# Patient Record
Sex: Female | Born: 1990 | Race: White | Hispanic: No | Marital: Single | Smoking: Former smoker
Health system: Southern US, Community
[De-identification: ages and names within clinical notes are randomized; demographics above are authoritative.]

## PROBLEM LIST (undated history)

## (undated) ENCOUNTER — Inpatient Hospital Stay (HOSPITAL_COMMUNITY): Payer: Self-pay

## (undated) DIAGNOSIS — Z789 Other specified health status: Secondary | ICD-10-CM

## (undated) HISTORY — PX: NO PAST SURGERIES: SHX2092

---

## 2011-09-01 ENCOUNTER — Encounter: Payer: Self-pay | Admitting: Emergency Medicine

## 2011-09-01 ENCOUNTER — Emergency Department (HOSPITAL_COMMUNITY)
Admission: EM | Admit: 2011-09-01 | Discharge: 2011-09-01 | Disposition: A | Discharge status: Home / Self Care | Payer: Managed Care, Other (non HMO) | Attending: Emergency Medicine | Admitting: Emergency Medicine

## 2011-09-01 ENCOUNTER — Emergency Department (HOSPITAL_COMMUNITY): Payer: Managed Care, Other (non HMO)

## 2011-09-01 DIAGNOSIS — J939 Pneumothorax, unspecified: Secondary | ICD-10-CM

## 2011-09-01 DIAGNOSIS — R079 Chest pain, unspecified: Secondary | ICD-10-CM | POA: Insufficient documentation

## 2011-09-01 DIAGNOSIS — R0602 Shortness of breath: Secondary | ICD-10-CM | POA: Insufficient documentation

## 2011-09-01 DIAGNOSIS — J9383 Other pneumothorax: Secondary | ICD-10-CM | POA: Insufficient documentation

## 2011-09-01 DIAGNOSIS — F17200 Nicotine dependence, unspecified, uncomplicated: Secondary | ICD-10-CM | POA: Insufficient documentation

## 2011-09-01 DIAGNOSIS — M25519 Pain in unspecified shoulder: Secondary | ICD-10-CM | POA: Insufficient documentation

## 2011-09-01 DIAGNOSIS — M549 Dorsalgia, unspecified: Secondary | ICD-10-CM | POA: Insufficient documentation

## 2011-09-01 MED ORDER — HYDROCODONE-ACETAMINOPHEN 5-325 MG PO TABS
1.0000 | ORAL_TABLET | Freq: Once | ORAL | Status: AC
  Administered 2011-09-01: 1 via ORAL
  Filled 2011-09-01: qty 1

## 2011-09-01 NOTE — ED Provider Notes (Signed)
20 year old, female, who smoked until today.  She complains of left-sided chest pain since yesterday.  He had sudden onset.  She denies a cough.  She says she has a sensation of slight shortness of breath.  When she takes a deep inspiration.  She went to an urgent care center, where she was seen and had a chest x-ray that showed an approximately 15% pneumothorax.  So she presented here.  She does not have pain or difficulty breathing.  When she is quiet respirations.  Her lungs are clear to auscultation nonlabored bilaterally.  We will put her in observation to perform repeat chest x-ray, and for observation.  There is no indication for a chest tube at this time.  3:19 PM No sxs. No change in size of ptx.  Discussed with Dr. Fredia Sorrow.   Will release and have return tomorrow.  Sooner if sxs increase  Nicholes Stairs, MD 09/01/11 1520

## 2011-09-01 NOTE — ED Notes (Signed)
Patient is resting comfortably. States pain is better. Denies new changes.

## 2011-09-01 NOTE — ED Notes (Signed)
States pain began yesterday while driving and progressively became worse. Seen at [facility] this am and dx with left apical pneumothorax. Hx of smoking. Denies other complaints. Plan is to monitor pt and repeat cxr in 6 hours.

## 2011-09-01 NOTE — ED Notes (Signed)
Pain in back last night and then her chest started  To hurt  Went to [facility] today had cxray and was found to have pneumo left side

## 2011-09-01 NOTE — ED Notes (Signed)
Pt denies status change. Small area of sq emphysema noticed under pt's left breast. Edp/pa aware, order given.

## 2011-09-01 NOTE — ED Provider Notes (Signed)
History     CSN: [id number] Arrival date & time: 09/01/2011 10:20 AM   First MD Initiated Contact with Patient 09/01/11 1031      No chief complaint on file.   (Consider location/radiation/quality/duration/timing/severity/associated sxs/prior treatment) Patient is a 20 y.o. female presenting with shortness of breath. The history is provided by the patient.  Shortness of Breath  The current episode started yesterday. The onset was sudden. The problem has been unchanged. The symptoms are relieved by nothing. The symptoms are aggravated by nothing. Associated symptoms include chest pain and shortness of breath. Pertinent negatives include no fever, no rhinorrhea, no sore throat and no cough.  Onset L back and shoulder pain last night while driving, associated with SOB at onset. Pt took a flexeril without relief. This AM she went to an outside [facility] and was found to have L pneumothorax. Pt is a smoker. She does not have a family history of pneumothorax.   History reviewed. No pertinent past medical history.  History reviewed. No pertinent past surgical history.  No family history on file.  History  Substance Use Topics  . Smoking status: Current Everyday Smoker  . Smokeless tobacco: Not on file  . Alcohol Use: Yes    OB History    Grav Para Term Preterm Abortions TAB SAB Ect Mult Living                  Review of Systems  Constitutional: Negative for fever and chills.  HENT: Negative for sore throat and rhinorrhea.   Eyes: Negative for discharge.  Respiratory: Positive for shortness of breath. Negative for cough.   Cardiovascular: Positive for chest pain. Negative for palpitations.  Gastrointestinal: Negative for nausea, vomiting, abdominal pain, diarrhea and constipation.  Genitourinary: Negative for dysuria and hematuria.  Musculoskeletal: Negative for myalgias.  Skin: Negative for rash.  Neurological: Negative for headaches.  Psychiatric/Behavioral: Negative for confusion.     Allergies  Review of patient's allergies indicates not on file.  Home Medications   Current Outpatient Rx  Name Route Sig Dispense Refill  . CYCLOBENZAPRINE HCL 10 MG PO TABS Oral Take 20 mg by mouth 3 (three) times daily as needed. For pain     . NORETHINDRON-ETHINYL ESTRAD-FE 1-20/1-30/1-35 MG-MCG PO TABS Oral Take 1 tablet by mouth daily.        BP 126/81  Pulse 81  Temp(Src) 98.3 F (36.8 C) (Oral)  Resp 20  SpO2 98%  Physical Exam  Nursing note and vitals reviewed. Constitutional: She is oriented to person, place, and time. She appears well-developed and well-nourished.  HENT:  Head: Normocephalic and atraumatic.  Eyes: Pupils are equal, round, and reactive to light. Right eye exhibits no discharge. Left eye exhibits no discharge.  Neck: Normal range of motion. Neck supple.  Cardiovascular: Normal rate and regular rhythm.  Exam reveals no gallop and no friction rub.   No murmur heard. Pulmonary/Chest: Effort normal. No respiratory distress. She has no wheezes.       Decreased L apex  Abdominal: Soft. There is no tenderness. There is no rebound and no guarding.  Musculoskeletal: Normal range of motion.  Neurological: She is alert and oriented to person, place, and time.  Skin: Skin is warm and dry. No rash noted.  Psychiatric: She has a normal mood and affect.    ED Course  Procedures (including critical care time)  Labs Reviewed - No data to display No results found.   No diagnosis found.  10:47 AM Patient seen  and examined. Oxygen 2L [place] applied. No respiratory distress.   11:15 AM Patient was discussed with Nicholes Stairs, MD Will move patient to CDU. Plan: Monitor for 6 hours and recheck CXR at that time. If she remains stable, will d/c to home with recheck tomorrow either in ED or at her urgent care. Discussed with CDU PA: Ardelle Park   MDM  Spontaneous pnuemothorax, O2 sat on room air is normal and patient is comfortable. Approx 10% pneumo. Will  monitor and d/c to home if she remains stable and have her f/u to ensure resolving pneumo.         Eustace Moore [place], [place] 09/01/11 1648  Medical screening examination/treatment/procedure(s) were conducted as a shared visit with non-physician practitioner(s) and myself.  I personally evaluated the patient during the encounter  Nicholes Stairs, MD 09/02/11 1240

## 2011-09-01 NOTE — ED Notes (Signed)
Patient has a left-sided pneumothorax. In monitor for the past 6 hours with no worsening of symptoms. Repeat chest x-ray shows the same amount of pneumothorax. Patient is recommended to return to the ED tomorrow for a repeat chest x-ray. Patient has also been instructed to return sooner if the symptoms worsen. Patient agrees with plan. I've discussed this with Dr. Weldon Inches is also agrees with plan.  Fayrene Helper, PA Resident 09/01/11 [phone]  Medical screening examination/treatment/procedure(s) were conducted as a shared visit with non-physician practitioner(s) and myself.  I personally evaluated the patient during the encounter  Nicholes Stairs, MD 09/02/11 1238

## 2011-09-01 NOTE — ED Notes (Signed)
Pt in gown and placed on continuous pulse oximetry and blood pressure cuff by Malachi Bonds, NT

## 2011-09-01 NOTE — ED Notes (Signed)
1:54 PM Patient complains of a "clicking sound" that can be heard and felt in the left lower chest. She denies significant pain, or increased shortness of breath. Lung examination, I do not appreciate decreased lung sounds. Liver, when palpate affected area left lower chest I can appreciate a vibrations and crepitus.  Dr. Weldon Inches has been notified.  Chest x-ray ordered. She is currently satting at 100% on 2 L O2, and in no acute distress.  Fayrene Helper, PA 09/02/11 0710  Nicholes Stairs, MD 09/02/11 [phone]

## 2011-09-01 NOTE — ED Notes (Signed)
Pt not in room at this time from triage 

## 2011-09-02 ENCOUNTER — Emergency Department (HOSPITAL_COMMUNITY)
Admission: EM | Admit: 2011-09-02 | Discharge: 2011-09-02 | Disposition: A | Discharge status: Home / Self Care | Payer: Managed Care, Other (non HMO) | Attending: Emergency Medicine | Admitting: Emergency Medicine

## 2011-09-02 ENCOUNTER — Emergency Department (HOSPITAL_COMMUNITY): Payer: Managed Care, Other (non HMO)

## 2011-09-02 DIAGNOSIS — J939 Pneumothorax, unspecified: Secondary | ICD-10-CM

## 2011-09-02 DIAGNOSIS — M546 Pain in thoracic spine: Secondary | ICD-10-CM | POA: Insufficient documentation

## 2011-09-02 DIAGNOSIS — R079 Chest pain, unspecified: Secondary | ICD-10-CM | POA: Insufficient documentation

## 2011-09-02 DIAGNOSIS — J9383 Other pneumothorax: Secondary | ICD-10-CM | POA: Insufficient documentation

## 2011-09-02 MED ORDER — HYDROCODONE-ACETAMINOPHEN 5-325 MG PO TABS
1.0000 | ORAL_TABLET | Freq: Once | ORAL | Status: AC
  Administered 2011-09-02: 1 via ORAL
  Filled 2011-09-02: qty 1

## 2011-09-02 MED ORDER — HYDROCODONE-ACETAMINOPHEN 5-500 MG PO TABS: 1.0000 | ORAL_TABLET | Freq: Four times a day (QID) | ORAL | Status: DC | PRN

## 2011-09-02 NOTE — ED Provider Notes (Signed)
Medical screening examination/treatment/procedure(s) were performed by non-physician practitioner and as supervising physician I was immediately available for consultation/collaboration.   Glynn Octave, MD 09/02/11 1843

## 2011-09-02 NOTE — ED Provider Notes (Signed)
History     CSN: [id number] Arrival date & time: 09/02/2011  7:45 AM   First MD Initiated Contact with Patient 09/02/11 [phone]      Chief Complaint  Patient presents with  . Back Pain    (Consider location/radiation/quality/duration/timing/severity/associated sxs/prior treatment) HPI Comments: Patient presents today for repeat chest x-ray after having been diagnosed with left sided 15% pneumothorax.  She denies any real change in her symptoms, reports midleft sided thoracic back pain, denies shortness of breath, nausea, vomiting, radiation of the pain.  Does report an episodic "clicking sound" noted just below left breast when she lies on her left side.  Patient is a 20 y.o. female presenting with back pain. The history is provided by the patient and a relative. No language interpreter was used.  Back Pain  This is a recurrent problem. The current episode started yesterday. The problem occurs constantly. The problem has not changed since onset.The pain is associated with no known injury. The pain is present in the thoracic spine. The quality of the pain is described as aching. The pain does not radiate. The pain is at a severity of 5/10. The pain is moderate. The pain is the same all the time. Associated symptoms include chest pain. Pertinent negatives include no numbness, no headaches, no abdominal pain, no leg pain, no paresthesias and no weakness. She has tried nothing for the symptoms. The treatment provided mild relief.    No past medical history on file.  No past surgical history on file.  No family history on file.  History  Substance Use Topics  . Smoking status: Current Everyday Smoker  . Smokeless tobacco: Not on file  . Alcohol Use: Yes    OB History    Grav Para Term Preterm Abortions TAB SAB Ect Mult Living                  Review of Systems  Constitutional: Negative for activity change and fatigue.  HENT: Negative.   Eyes: Negative.   Respiratory: Negative for  cough, chest tightness, shortness of breath and wheezing.   Cardiovascular: Positive for chest pain. Negative for palpitations.  Gastrointestinal: Negative.  Negative for abdominal pain.  Genitourinary: Negative.   Musculoskeletal: Positive for back pain.  Neurological: Negative for weakness, numbness, headaches and paresthesias.  Hematological: Negative.   Psychiatric/Behavioral: Negative.     Allergies  Review of patient's allergies indicates no known allergies.  Home Medications   Current Outpatient Rx  Name Route Sig Dispense Refill  . CYCLOBENZAPRINE HCL 10 MG PO TABS Oral Take 20 mg by mouth 3 (three) times daily as needed. For pain     . NORETHINDRON-ETHINYL ESTRAD-FE 1-20/1-30/1-35 MG-MCG PO TABS Oral Take 1 tablet by mouth daily.        BP 100/57  Pulse 71  Temp(Src) 97.9 F (36.6 C) (Oral)  Resp 18  SpO2 99%  Physical Exam  Constitutional: She is oriented to person, place, and time. She appears well-developed and well-nourished.  HENT:  Head: Normocephalic and atraumatic.  Eyes: Conjunctivae and EOM are normal. Pupils are equal, round, and reactive to light.  Neck: Normal range of motion. Neck supple.  Cardiovascular: Normal rate, regular rhythm, S1 normal and S2 normal.  Exam reveals no gallop and no friction rub.   No murmur heard.      Patient reports "click" sound with lying on left recumbant position - I was not able to auscultate this  Pulmonary/Chest: Effort normal and breath sounds normal. No  respiratory distress. She has no wheezes. She exhibits no tenderness.  Abdominal: Soft. Bowel sounds are normal.  Neurological: She is alert and oriented to person, place, and time.  Skin: Skin is warm and dry.  Psychiatric: She has a normal mood and affect. Her behavior is normal. Judgment and thought content normal.    ED Course  Procedures (including critical care time)  Labs Reviewed - No data to display Dg Chest Portable 1 View  09/01/2011  *RADIOLOGY  REPORT*  Clinical Data: Pneumothorax, left chest pain  PORTABLE CHEST - 1 VIEW  Comparison: None available  Findings: Left apical pneumothorax noted estimated at approximately 20%.  Lungs are otherwise clear.  Normal heart size and vascularity.  No effusions.  Trachea is midline.  Slight curvature of the spine evident versus mild scoliosis.  IMPRESSION: Left apical pneumothorax.  Critical Value/emergent results were called by telephone at the time of interpretation on 09/01/2011   at 2:45 p.m.  to  Dr. Weldon Inches, who verbally acknowledged these results.  Original Report Authenticated By: Judie Petit. Ruel Favors, M.D.     Left pneumothorax   MDM  Patient returns for repeat chest x-ray for further evaluation of 15% pneumo.  Patient with interval decrease in size of left pneumothorax.  Is having mild left mid thoracic back pain which I do not believe is related to the PTX, plan short course of pain medication and patient will return tomorrow for repeat x-ray.       Izola Price [place], [place] 09/02/11 0818  Izola Price [place], [place] 09/02/11 [phone]

## 2011-09-02 NOTE — ED Notes (Signed)
Patient woke at 530 yesterday stating she could not breathe and complained of pain in her upper left back.  Patient was seen at urgent care and told she had a 15 percent pneumo on the left side.  Patient then brought to Ed,  She was observed and had a repeat xray.  Patient was sent home.  Today patient has returned to have repeat chest xray.  Patient states she is still having upper left sided back pain.  Patient denies shortness of breath.

## 2011-09-03 ENCOUNTER — Encounter (HOSPITAL_COMMUNITY): Payer: Self-pay | Admitting: *Deleted

## 2011-09-03 ENCOUNTER — Emergency Department (HOSPITAL_COMMUNITY)
Admission: EM | Admit: 2011-09-03 | Discharge: 2011-09-03 | Disposition: A | Discharge status: Home / Self Care | Payer: Managed Care, Other (non HMO) | Attending: Emergency Medicine | Admitting: Emergency Medicine

## 2011-09-03 ENCOUNTER — Emergency Department (HOSPITAL_COMMUNITY): Payer: Managed Care, Other (non HMO)

## 2011-09-03 DIAGNOSIS — J939 Pneumothorax, unspecified: Secondary | ICD-10-CM

## 2011-09-03 DIAGNOSIS — Z09 Encounter for follow-up examination after completed treatment for conditions other than malignant neoplasm: Secondary | ICD-10-CM | POA: Insufficient documentation

## 2011-09-03 DIAGNOSIS — J9383 Other pneumothorax: Secondary | ICD-10-CM | POA: Insufficient documentation

## 2011-09-03 NOTE — ED Notes (Signed)
Pt is here for repeat chest xray after being dx with pneumothorax on thursday

## 2011-09-03 NOTE — ED Provider Notes (Signed)
Medical screening examination/treatment/procedure(s) were performed by non-physician practitioner and as supervising physician I was immediately available for consultation/collaboration.   Nelia Shi, MD 09/03/11 1100

## 2011-09-03 NOTE — ED Provider Notes (Signed)
History     CSN: [id number] Arrival date & time: 09/03/2011  7:19 AM   First MD Initiated Contact with Patient 09/03/11 0744   7:56 AM HPI Martha Navarro is a 20 y.o. female here for an imaging recheck. Was diagnosed with a 20% left apical pneumothorax on 09/01/2011. History came here for a recheck imaging and was found to have decreased in size to 10%. Was advised to return today for further recheck. Patient is completely asymptomatic. States initially he had left-sided back pain and shortness of breath. Has not had to take any medication for pain. Denies currently having any pain or shortness of breath.  Past Medical History  Diagnosis Date  . Pneumothorax     History reviewed. No pertinent past surgical history.  No family history on file.  History  Substance Use Topics  . Smoking status: Former Smoker    Quit date: 09/01/2011  . Smokeless tobacco: Not on file  . Alcohol Use: Yes    OB History    Grav Para Term Preterm Abortions TAB SAB Ect Mult Living                  Review of Systems  HENT: Negative for neck pain.   Respiratory: Negative for cough and shortness of breath.   Cardiovascular: Negative for chest pain.  Gastrointestinal: Negative for nausea and abdominal pain.  Genitourinary: Negative for dysuria, hematuria and flank pain.  Musculoskeletal: Negative for back pain.       Denies saddle anesthesias, or perineal numbness, urinary or bowel incontinence  Neurological: Negative for weakness, numbness and headaches.    Allergies  Review of patient's allergies indicates no known allergies.  Home Medications   Current Outpatient Rx  Name Route Sig Dispense Refill  . NORETHINDRON-ETHINYL ESTRAD-FE 1-20/1-30/1-35 MG-MCG PO TABS Oral Take 1 tablet by mouth daily.      . CYCLOBENZAPRINE HCL 10 MG PO TABS Oral Take 20 mg by mouth 3 (three) times daily as needed. For pain     . HYDROCODONE-ACETAMINOPHEN 5-500 MG PO TABS Oral Take 1-2 tablets by mouth every 6  (six) hours as needed for pain. 15 tablet 0    BP 104/60  Pulse 88  Temp(Src) 98.8 F (37.1 C) (Oral)  Resp 18  SpO2 99%  Physical Exam  Vitals reviewed. Constitutional: She is oriented to person, place, and time. Vital signs are normal. She appears well-developed and well-nourished.  HENT:  Head: Normocephalic and atraumatic.  Eyes: Conjunctivae are normal. Pupils are equal, round, and reactive to light.  Neck: Normal range of motion. Neck supple.  Cardiovascular: Normal rate, regular rhythm and normal heart sounds.  Exam reveals no friction rub.   No murmur heard. Pulmonary/Chest: Effort normal and breath sounds normal. She has no wheezes. She has no rhonchi. She has no rales. She exhibits no tenderness.  Musculoskeletal: Normal range of motion.  Neurological: She is alert and oriented to person, place, and time. Coordination normal.  Skin: Skin is warm and dry. No rash noted. No erythema. No pallor.    ED Course  Procedures (including critical care time)  Dg Chest Portable 1 View  09/01/2011  *RADIOLOGY REPORT*  Clinical Data: Pneumothorax, left chest pain  PORTABLE CHEST - 1 VIEW  Comparison: None available  Findings: Left apical pneumothorax noted estimated at approximately 20%.  Lungs are otherwise clear.  Normal heart size and vascularity.  No effusions.  Trachea is midline.  Slight curvature of the spine evident versus mild scoliosis.  IMPRESSION: Left apical pneumothorax.  Critical Value/emergent results were called by telephone at the time of interpretation on 09/01/2011   at 2:45 p.m.  to  Dr. Weldon Inches, who verbally acknowledged these results.  Original Report Authenticated By: Judie Petit. Ruel Favors, M.D.     Dg Chest 2 View  09/02/2011  *RADIOLOGY REPORT*  Clinical Data: Spontaneous left pneumothorax.  CHEST - 2 VIEW  Comparison: 09/01/2011  Findings: The left pneumothorax shows slight reduction in size since yesterday's chest x-ray with current estimated volume of 10 - 15%.   No underlying parenchymal abnormality identified.  No pleural effusion.  No evidence of rib fracture.  IMPRESSION: Slight reduction in size of the left pneumothorax.  Original Report Authenticated By: Reola Calkins, M.D.      Dg Chest 2 View  09/03/2011  *RADIOLOGY REPORT*  Clinical Data: Chest pain, shortness of breath, pneumothorax  CHEST - 2 VIEW  Comparison: 09/02/2011  Findings: Small to moderate left apical pneumothorax, stable versus minimally decreased.  Lungs otherwise clear.  No pleural effusion.  Cardiomediastinal silhouette is within normal limits.  IMPRESSION: Small to moderate left apical pneumothorax, stable versus minimally decreased.  Original Report Authenticated By: Charline Bills, M.D.    Patient's pneumothorax is stable versus decreased. Continued advised recheck. Advised followup with urgent care.    MDM          Thomasene Lot, PA 09/03/11 [phone]

## 2011-09-05 ENCOUNTER — Encounter (HOSPITAL_COMMUNITY): Payer: Self-pay | Admitting: Emergency Medicine

## 2011-09-12 ENCOUNTER — Encounter: Payer: Self-pay | Admitting: Internal Medicine

## 2011-09-12 ENCOUNTER — Institutional Professional Consult (permissible substitution): Payer: Managed Care, Other (non HMO) | Admitting: Internal Medicine

## 2011-09-13 ENCOUNTER — Ambulatory Visit (INDEPENDENT_AMBULATORY_CARE_PROVIDER_SITE_OTHER): Payer: Managed Care, Other (non HMO) | Admitting: Internal Medicine

## 2011-09-13 ENCOUNTER — Encounter: Payer: Self-pay | Admitting: Internal Medicine

## 2011-09-13 VITALS — BP 104/70 | HR 67 | Temp 98.0°F | Ht 68.0 in | Wt 126.8 lb

## 2011-09-13 DIAGNOSIS — M2550 Pain in unspecified joint: Secondary | ICD-10-CM

## 2011-09-13 DIAGNOSIS — J9383 Other pneumothorax: Secondary | ICD-10-CM

## 2011-09-13 NOTE — Assessment & Plan Note (Signed)
#  Pneumothorax - Left side. First episode 09/11/11. STill persistent on cxr 09/11/11 with small residue 5% but asymptomatic  PLAN Most likely cause is smoking along with something intrinsic about you. Less likely cause is related to menstrual cycles Outcomes are generally good and if you quit smoking unlikely you will have one in future but there is always risk for recurrence Please return in 10 days to see me or my NP Tammy with CXR Please refrain from smoking - the number 1 risk factor for pneumothorax Please avoid lifting weight, straining, strenuous work, coughing a lot, or running.  If cough gets worse or shortness of breath gets worse, call us or go to ER AT followup if pneumothorax still there, you might need a small chest tube ("heimlich") by radiology

## 2011-09-13 NOTE — Patient Instructions (Addendum)
#  Pneumothorax - Left side Most likely cause is smoking along with something intrinsic about you. Less likely cause is related to menstrual cycles Outcomes are generally good and if you quit smoking unlikely you will have one in future but there is always risk for recurrence Please return in 10 days to see me or my NP Tammy with CXR Please refrain from smoking - the number 1 risk factor for pneumothorax Please avoid lifting weight, straining, strenuous work, coughing a lot, or running.  If cough gets worse or shortness of breath gets worse, call us or go to ER AT followup if pneumothorax still there, you might need a small chest tube ("heimlich") by radiology #ARthralgia  - if this persists at followup, will get autoimmune panel

## 2011-09-13 NOTE — Progress Notes (Signed)
Subjective:    Patient ID: Martha Navarro, female    DOB: 07/15/1991, 20 y.o.   MRN: [id number]  HPI  20 year old female. Accompanied by mom. Started smoking in 8th grade - 2007 (cigars and cigarettes, 1/2 pack a day).   States that 2 weeks AGO ON 08/31/11  was driving and noticed acute left infrascapular pain, that was severe and radiated to front of chest and neck with associated dyspnea. Could not sleep that night due to pain and could not stand straight. Following day went to [facility] urgent care. CXR showed left pneumothorax. REportedly told "15% collapse". Referred to [facility] ER. Per hx observed all day 10a-5p per mom. SErial CXR showed stability/slight improvement per mom. Next day 09/02/11 and following day 09/03/11 went baclk to ER for followup: CXR reportedly showd stability/slight improvement per mom (and per cxr reports that I reviewed). Subsequently went to [facility] on 09/06/11 showed persistent ptx though somewhat improved. By now symptoms had largely improved. Per mom, CVTS consultation wsa being considered by [facility] but later changed to pulmonary consultation and then referred here. Then had another CXR (not available to me) which shows 5% collapse per mom. Patient says she is doing ADLs, normal life and able to run at beach but almost asymptomatic per patient. Denies chest pain, wheezing except occassional chest pain needing vicodin.  Family hx: Dad 6'3". Brother 5'9". Patient height: 5'8". No COPD., No collapsed lung. Maternal grandautn with Rheumatoid arthritis  Social: She quit smoking on day of dx of pneumothorax. Cough, sneezing, and cold symptoms now for 3 days but no sputum. 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   Gyn hx: Puberty at age 74. Ws on birth control age [age] due to irregular menses. Then different methods till march 2012 when she stopped all pills. Then cycles got irregular. Then in beginning of October started low low estrogen when she had ptx. Vicodin for ptx increased  vaginal bleed and she stopped estrogen.    ROS: bruises, arthralgia, nos past few weeks. Needs vicodin prn  Review of Systems  Constitutional: Positive for fever and unexpected weight change.  HENT: Positive for rhinorrhea, sneezing, dental problem and sinus pressure. Negative for ear pain, nosebleeds, congestion, sore throat, trouble swallowing and postnasal drip.   Eyes: Negative for redness and itching.  Respiratory: Positive for cough, chest tightness and shortness of breath. Negative for wheezing.   Cardiovascular: Positive for palpitations. Negative for leg swelling.  Gastrointestinal: Negative for nausea and vomiting.  Genitourinary: Negative for dysuria.  Musculoskeletal: Negative for joint swelling.  Skin: Negative for rash.  Neurological: Positive for headaches.  Hematological: Bruises/bleeds easily.  Psychiatric/Behavioral: Positive for dysphoric mood. The patient is nervous/anxious.        Objective:   Physical Exam  Vitals reviewed. Constitutional: She is oriented to person, place, and time. She appears well-developed and well-nourished. No distress.       Thin but not cachectic  HENT:  Head: Normocephalic and atraumatic.  Right Ear: External ear normal.  Left Ear: External ear normal.  Mouth/Throat: Oropharynx is clear and moist. No oropharyngeal exudate.  Eyes: Conjunctivae and EOM are normal. Pupils are equal, round, and reactive to light. Right eye exhibits no discharge. Left eye exhibits no discharge. No scleral icterus.  Neck: Normal range of motion. Neck supple. No JVD present. No tracheal deviation present. No thyromegaly present.  Cardiovascular: Normal rate, regular rhythm, normal heart sounds and intact distal pulses.  Exam reveals no gallop and no friction rub.  No murmur heard. Pulmonary/Chest: Effort normal and breath sounds normal. No respiratory distress. She has no wheezes. She has no rales. She exhibits no tenderness.  Abdominal: Soft. Bowel sounds  are normal. She exhibits no distension and no mass. There is no tenderness. There is no rebound and no guarding.  Musculoskeletal: Normal range of motion. She exhibits no edema and no tenderness.  Lymphadenopathy:    She has no cervical adenopathy.  Neurological: She is alert and oriented to person, place, and time. She has normal reflexes. No cranial nerve deficit. She exhibits normal muscle tone. Coordination normal.  Skin: Skin is warm and dry. No rash noted. She is not diaphoretic. No erythema. No pallor.       tatoo +  Psychiatric: She has a normal mood and affect. Her behavior is normal. Judgment and thought content normal.          Assessment & Plan:

## 2011-09-13 NOTE — Assessment & Plan Note (Signed)
If persists at [facility], get autoimmune panel

## 2011-10-06 ENCOUNTER — Other Ambulatory Visit: Payer: Self-pay | Admitting: *Deleted

## 2011-10-06 DIAGNOSIS — J9383 Other pneumothorax: Secondary | ICD-10-CM

## 2011-10-07 ENCOUNTER — Telehealth: Payer: Self-pay | Admitting: Internal Medicine

## 2011-10-07 ENCOUNTER — Ambulatory Visit (INDEPENDENT_AMBULATORY_CARE_PROVIDER_SITE_OTHER): Payer: Managed Care, Other (non HMO) | Admitting: Internal Medicine

## 2011-10-07 ENCOUNTER — Ambulatory Visit (INDEPENDENT_AMBULATORY_CARE_PROVIDER_SITE_OTHER)
Admission: RE | Admit: 2011-10-07 | Discharge: 2011-10-07 | Disposition: A | Discharge status: Home / Self Care | Payer: Managed Care, Other (non HMO) | Source: Ambulatory Visit | Attending: Internal Medicine | Admitting: Internal Medicine

## 2011-10-07 ENCOUNTER — Encounter: Payer: Self-pay | Admitting: Internal Medicine

## 2011-10-07 VITALS — BP 90/54 | HR 78 | Temp 98.6°F | Ht 68.0 in | Wt 132.4 lb

## 2011-10-07 DIAGNOSIS — M2550 Pain in unspecified joint: Secondary | ICD-10-CM

## 2011-10-07 DIAGNOSIS — J9383 Other pneumothorax: Secondary | ICD-10-CM

## 2011-10-07 DIAGNOSIS — Z23 Encounter for immunization: Secondary | ICD-10-CM

## 2011-10-07 NOTE — Assessment & Plan Note (Signed)
Resolved. We'll expectantly monitor

## 2011-10-07 NOTE — Telephone Encounter (Signed)
Ptx.resolved. Results called and given to patient

## 2011-10-07 NOTE — Patient Instructions (Addendum)
#  Pneumothorax - Left side Will look at CXR and call you  If pneumothorax still there, you might need a small chest tube ("heimlich") by radiology Have flu shot today

## 2011-10-07 NOTE — Assessment & Plan Note (Signed)
After she left the office radiology system was up and running. Chest x-ray reported as resolved pneumothorax. I spoke to her on the phone and advised no further followup unless symptomatic. Strongly cautioned against smoking ever she agreed

## 2011-10-07 NOTE — Progress Notes (Signed)
Subjective:    Patient ID: Martha Navarro, female    DOB: [birth year], 20 y.o.   MRN: [id number]  HPI  20 year old female. Accompanied by mom. Started smoking in 8th grade - 2007 (cigars and cigarettes, 1/2 pack a day).   States that 2 weeks AGO ON 08/31/11  was driving and noticed acute left infrascapular pain, that was severe and radiated to front of chest and neck with associated dyspnea. Could not sleep that night due to pain and could not stand straight. Following day went to [facility] urgent care. CXR showed left pneumothorax. REportedly told "15% collapse". Referred to [facility] ER. Per hx observed all day 10a-5p per mom. SErial CXR showed stability/slight improvement per mom. Next day 09/02/11 and following day 09/03/11 went baclk to ER for followup: CXR reportedly showd stability/slight improvement per mom (and per cxr reports that I reviewed). Subsequently went to [facility] on 09/06/11 showed persistent ptx though somewhat improved. By now symptoms had largely improved. Per mom, CVTS consultation wsa being considered by [facility] but later changed to pulmonary consultation and then referred here. Then had another CXR (not available to me) which shows 5% collapse per mom. Patient says she is doing ADLs, normal life and able to run at beach but almost asymptomatic per patient. Denies chest pain, wheezing except occassional chest pain needing vicodin.  Family hx: Dad 6'3". Brother 5'9". Patient height: 5'8". No COPD., No collapsed lung. Maternal grandautn with Rheumatoid arthritis  Social: She quit smoking on day of dx of pneumothorax. Cough, sneezing, and cold symptoms now for 3 days but no sputum. 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   Gyn hx: Puberty at age 6. Ws on birth control age [age] due to irregular menses. Then different methods till march 2012 when she stopped all pills. Then cycles got irregular. Then in beginning of October started low low estrogen when she had ptx. Vicodin for ptx increased  vaginal bleed and she stopped estrogen.    ROS: bruises, arthralgia, nos past few weeks. Needs vicodin prn   REC #Pneumothorax - Left side Most likely cause is smoking along with something intrinsic about you. Less likely cause is related to menstrual cycles Outcomes are generally good and if you quit smoking unlikely you will have one in future but there is always risk for recurrence Please return in 10 days to see me or my NP Tammy with CXR Please refrain from smoking - the number 1 risk factor for pneumothorax Please avoid lifting weight, straining, strenuous work, coughing a lot, or running.  If cough gets worse or shortness of breath gets worse, call us or go to ER AT followup if pneumothorax still there, you might need a small chest tube ("heimlich") by radiology #ARthralgia  - if this persists at followup, will get autoimmune panel   OV 10/07/2011 Followup spontaneous left-sided pneumothorax. Returns of a chest x-ray today; still pending due to system shut down. Feels well overall. She subjectively feels normal and that her lungs are expanded. Only thing she reports is that when occasionally lifting heavy items that she feels some heaviness in her left chest but otherwise well. She insisted she quit smoking. She denies using any marijuana or cocaine. She says she'll never go back to smoking cigarettes again. Denies arthralgias this visit  Past, Family, Social reviewed: no change since last visit   Review of Systems  Constitutional: Negative for fever and unexpected weight change.  HENT: Negative for ear pain, nosebleeds, congestion, sore throat,  rhinorrhea, sneezing, trouble swallowing, dental problem, postnasal drip and sinus pressure.   Eyes: Negative for redness and itching.  Respiratory: Negative for cough, chest tightness, shortness of breath and wheezing.   Cardiovascular: Negative for palpitations and leg swelling.  Gastrointestinal: Negative for nausea and vomiting.    Genitourinary: Negative for dysuria.  Musculoskeletal: Negative for joint swelling.  Skin: Negative for rash.  Neurological: Negative for headaches.  Hematological: Does not bruise/bleed easily.  Psychiatric/Behavioral: Negative for dysphoric mood. The patient is not nervous/anxious.        Objective:   Physical Exam Physical Exam  Vitals reviewed. Constitutional: She is oriented to person, place, and time. She appears well-developed and well-nourished. No distress.       Thin but not cachectic  HENT:  Head: Normocephalic and atraumatic.  Right Ear: External ear normal.  Left Ear: External ear normal.  Mouth/Throat: Oropharynx is clear and moist. No oropharyngeal exudate.  Eyes: Conjunctivae and EOM are normal. Pupils are equal, round, and reactive to light. Right eye exhibits no discharge. Left eye exhibits no discharge. No scleral icterus.  Neck: Normal range of motion. Neck supple. No JVD present. No tracheal deviation present. No thyromegaly present.  Cardiovascular: Normal rate, regular rhythm, normal heart sounds and intact distal pulses.  Exam reveals no gallop and no friction rub.   No murmur heard. Pulmonary/Chest: Effort normal and breath sounds normal. No respiratory distress. She has no wheezes. She has no rales. She exhibits no tenderness.  Abdominal: Soft. Bowel sounds are normal. She exhibits no distension and no mass. There is no tenderness. There is no rebound and no guarding.  Musculoskeletal: Normal range of motion. She exhibits no edema and no tenderness.  Lymphadenopathy:    She has no cervical adenopathy.  Neurological: She is alert and oriented to person, place, and time. She has normal reflexes. No cranial nerve deficit. She exhibits normal muscle tone. Coordination normal.  Skin: Skin is warm and dry. No rash noted. She is not diaphoretic. No erythema. No pallor.       tatoo +  Psychiatric: She has a normal mood and affect. Her behavior is normal. Judgment  and thought content normal.          Assessment & Plan:

## 2013-01-25 IMAGING — CR DG CHEST 2V
2 series · 2 of 2 positions shown · non-contrast
Comparison: 09/03/2011.

CLINICAL DATA: Follow-up pneumothorax.

CHEST - 2 VIEW

[view not recorded (1 of 2)]
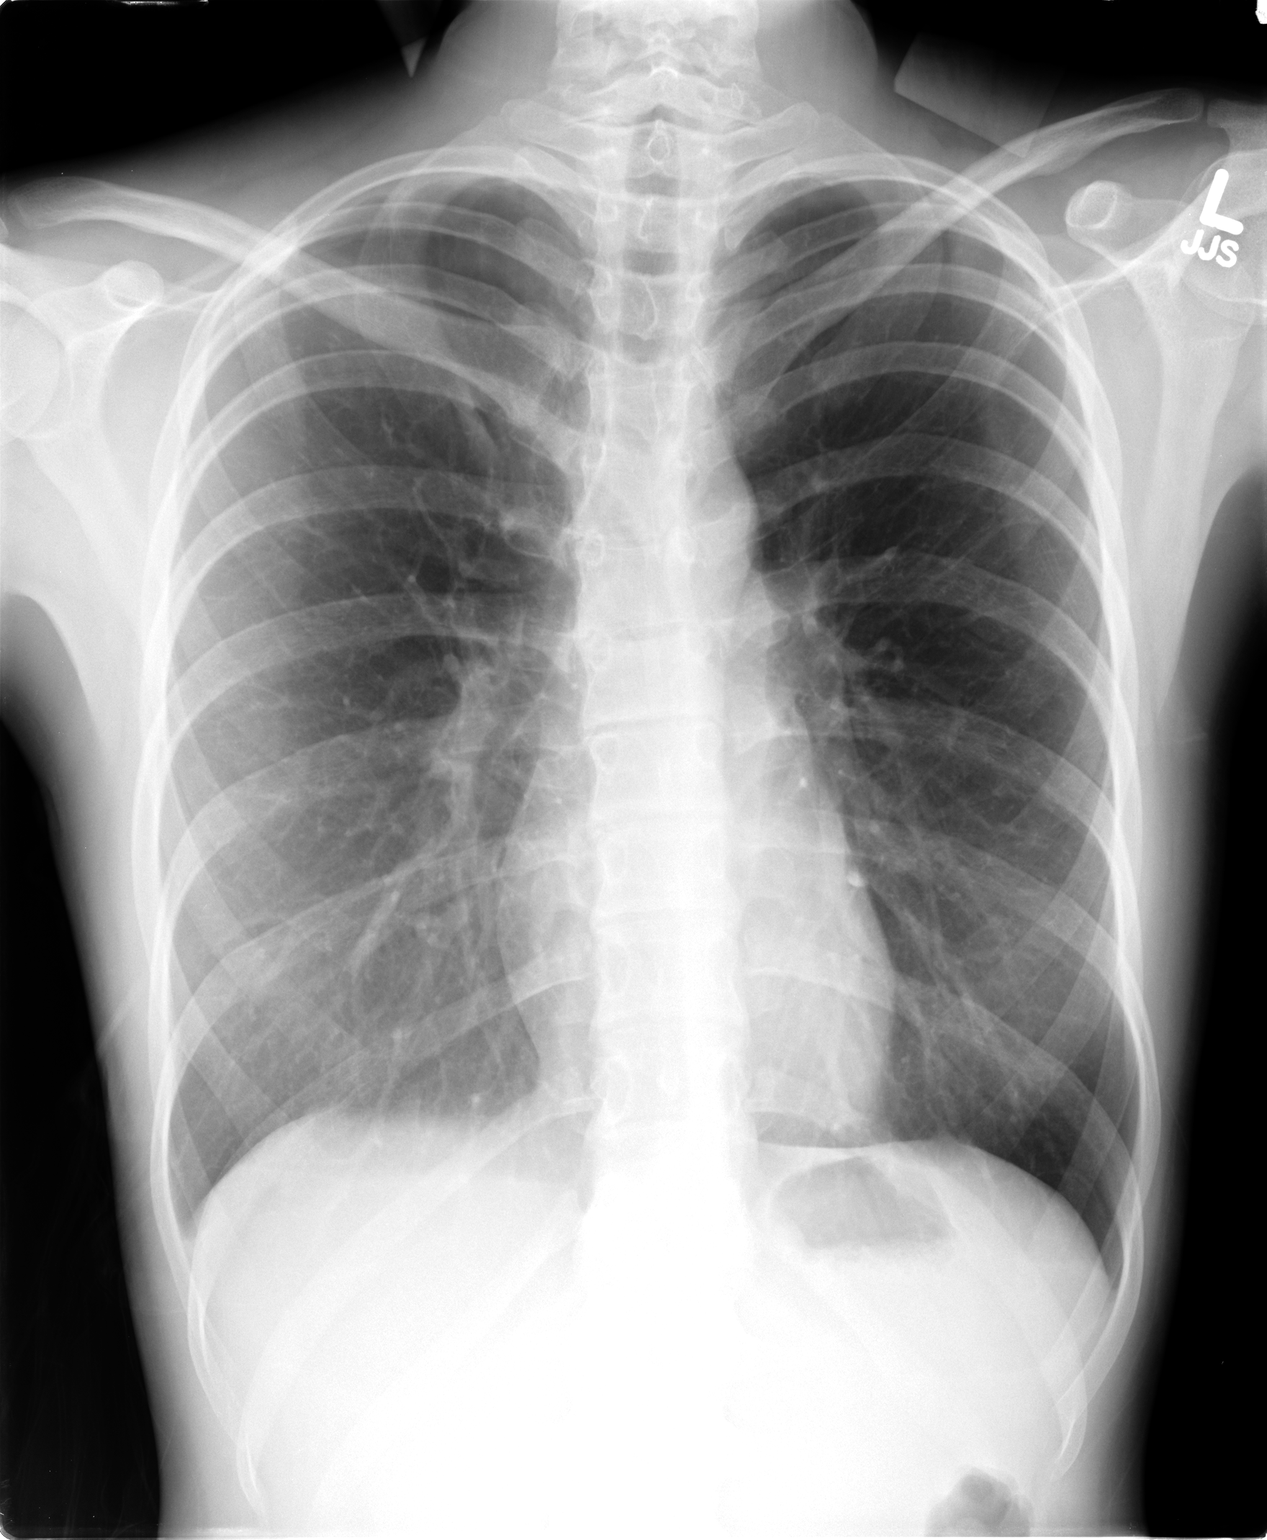

[view not recorded (2 of 2)]
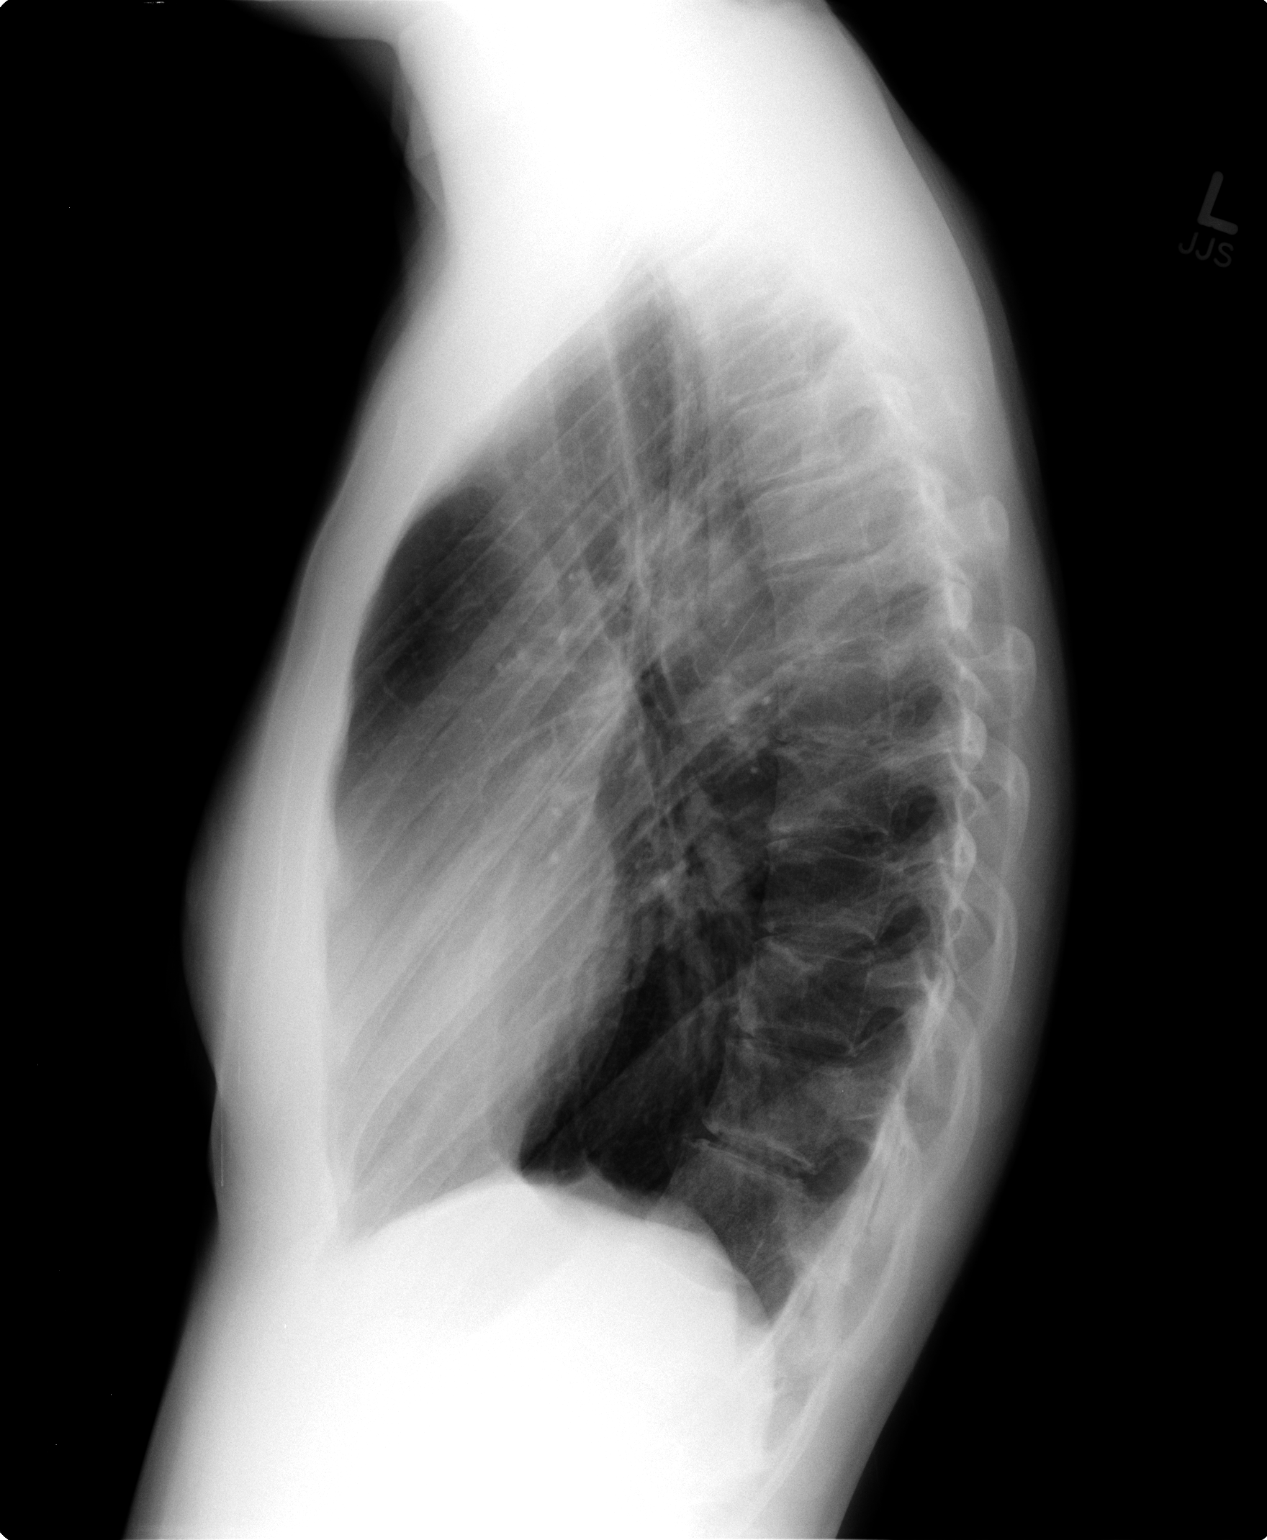

[2 of 2 positions shown; findings below may reference images not displayed]

FINDINGS: Trachea is midline.  Heart size normal.  Lungs are
hyperinflated but clear.  No residual or recurrent left
pneumothorax.  No pleural fluid.
IMPRESSION: No acute findings.  Resolved left pneumothorax.

## 2015-10-25 NOTE — L&D Delivery Note (Signed)
Delivery Note At 11:09 PM a viable female was delivered via Vaginal, Spontaneous Delivery (Presentation: vertex).  APGAR: 9, 10; weight pending  .   Placenta status: intact.  Cord:  3 V with the following complications:none .  Cord pH: n/a  Anesthesia:   Episiotomy: None Lacerations: 2nd degree;Perineal and rt labial Suture Repair: 2.0 3.0 vicryl Est. Blood Loss (mL):  300 Mom to postpartum.  Baby to Couplet care / Skin to Skin.  Kary Colaizzi Y 09/29/2016, 11:40 PM

## 2016-02-17 LAB — OB RESULTS CONSOLE ANTIBODY SCREEN: ANTIBODY SCREEN: NEGATIVE

## 2016-02-17 LAB — OB RESULTS CONSOLE RUBELLA ANTIBODY, IGM: RUBELLA: UNDETERMINED

## 2016-02-17 LAB — OB RESULTS CONSOLE GC/CHLAMYDIA
Chlamydia: NEGATIVE
GC PROBE AMP, GENITAL: NEGATIVE

## 2016-02-17 LAB — OB RESULTS CONSOLE ABO/RH: RH TYPE: POSITIVE

## 2016-02-17 LAB — OB RESULTS CONSOLE HIV ANTIBODY (ROUTINE TESTING): HIV: NONREACTIVE

## 2016-02-17 LAB — OB RESULTS CONSOLE HEPATITIS B SURFACE ANTIGEN: HEP B S AG: NEGATIVE

## 2016-02-17 LAB — OB RESULTS CONSOLE RPR: RPR: NONREACTIVE

## 2016-04-22 ENCOUNTER — Other Ambulatory Visit (HOSPITAL_COMMUNITY): Payer: Self-pay | Admitting: Obstetrics and Gynecology

## 2016-04-22 ENCOUNTER — Encounter (HOSPITAL_COMMUNITY): Payer: Self-pay

## 2016-04-22 ENCOUNTER — Ambulatory Visit (HOSPITAL_COMMUNITY)
Admission: RE | Admit: 2016-04-22 | Discharge: 2016-04-22 | Disposition: A | Discharge status: Home / Self Care | Payer: Medicaid Other | Source: Ambulatory Visit | Attending: Obstetrics and Gynecology | Admitting: Obstetrics and Gynecology

## 2016-04-22 DIAGNOSIS — O368390 Maternal care for abnormalities of the fetal heart rate or rhythm, unspecified trimester, not applicable or unspecified: Secondary | ICD-10-CM

## 2016-04-22 DIAGNOSIS — IMO0002 Reserved for concepts with insufficient information to code with codable children: Secondary | ICD-10-CM

## 2016-04-22 DIAGNOSIS — Z3689 Encounter for other specified antenatal screening: Secondary | ICD-10-CM

## 2016-04-22 DIAGNOSIS — Z36 Encounter for antenatal screening of mother: Secondary | ICD-10-CM | POA: Diagnosis not present

## 2016-04-22 DIAGNOSIS — Z3A17 17 weeks gestation of pregnancy: Secondary | ICD-10-CM | POA: Insufficient documentation

## 2016-04-29 ENCOUNTER — Encounter (HOSPITAL_COMMUNITY): Payer: Self-pay

## 2016-04-29 NOTE — Progress Notes (Signed)
MATERNAL FETAL MEDICINE CONSULT  Patient Name: Martha Navarro Medical Record Number:  [id number] Date of Birth: 20-Aug-1991 Requesting Physician Name:  Dr. Su Hiltoberts Date of Service: 04/29/2016  Chief Complaint Fetal arrhythmia  History of Present Illness Martha Navarro was seen today secondary to fetal arrhythmia at the request of Dr. Su Hiltoberts.  The patient is a 25 y.o. G1P0,at 5056w2d with an EDD of 09/28/2016, by Ultrasound dating method.  She was noted to have a fetal arrhthymia early today in her primary OBs office and was urgently added to our scheduled today.  She has no acute complaints or concerns, other than the fetal arrhythmia.  Review of Systems Pertinent items are noted in HPI.  Patient History OB History  Gravida Para Term Preterm AB SAB TAB Ectopic Multiple Living  1         0    # Outcome Date GA Lbr Len/2nd Weight Sex Delivery Anes PTL Lv  1 Current               Past Medical History  Diagnosis Date  . Pneumothorax     Past Surgical History  Procedure Laterality Date  . No past surgeries      Social History   Social History  . Marital Status: Single    Spouse Name: N/A  . Number of Children: N/A  . Years of Education: N/A   Social History Main Topics  . Smoking status: Former Smoker    Quit date: 09/01/2011  . Smokeless tobacco: Not on file  . Alcohol Use: Yes  . Drug Use: Not on file  . Sexual Activity: Not on file   Other Topics Concern  . Not on file   Social History Narrative    Family History  Problem Relation Age of Onset  . Liver cancer Maternal Grandmother    In addition, the patient has no family history of mental retardation, birth defects, or genetic diseases.  Physical Examination There were no vitals filed for this visit. General appearance - alert, well appearing, and in no distress Mental status - alert, oriented to person, place, and time Abdomen - soft, nontender, nondistended, no masses or organomegaly Extremities - no  pedal edema noted  Assessment and Recommendations 1.  Atrial bigeminy with 2:1 conduction.  The heart appears structural normal but was not completely visualized due to fetal position.  Asssement with M-mode shows atrial bigeminy with 2:1 conduction.  The atrial rate is approximately 120-140 with a ventricular rate of 60-70.  The fetus is very active and thus appears to be tolerating the ventricular rate well.  Given that the risk of rapid fetal deterioration is low, [name] can be managed as an outpatient.  We will try and schedule a fetal echo next week.  She will need a repeat OB ultrasound in 4 weeks.  I spent 30 minutes with [name] today of which 50% was face-to-face counseling.  Thank you [name].  Please do not hesitate to contact us with questions.   Rema FendtNITSCHE,Karnisha Lefebre, MD

## 2016-08-21 ENCOUNTER — Inpatient Hospital Stay (HOSPITAL_COMMUNITY)
Admission: AD | Admit: 2016-08-21 | Discharge: 2016-08-21 | Disposition: A | Discharge status: Home / Self Care | Payer: Medicaid Other | Source: Ambulatory Visit | Attending: Obstetrics and Gynecology | Admitting: Obstetrics and Gynecology

## 2016-08-21 ENCOUNTER — Encounter (HOSPITAL_COMMUNITY): Payer: Self-pay | Admitting: *Deleted

## 2016-08-21 ENCOUNTER — Inpatient Hospital Stay (HOSPITAL_BASED_OUTPATIENT_CLINIC_OR_DEPARTMENT_OTHER): Payer: Medicaid Other

## 2016-08-21 DIAGNOSIS — Z3A34 34 weeks gestation of pregnancy: Secondary | ICD-10-CM | POA: Insufficient documentation

## 2016-08-21 DIAGNOSIS — Z87891 Personal history of nicotine dependence: Secondary | ICD-10-CM | POA: Insufficient documentation

## 2016-08-21 DIAGNOSIS — M79609 Pain in unspecified limb: Secondary | ICD-10-CM | POA: Diagnosis not present

## 2016-08-21 DIAGNOSIS — O9989 Other specified diseases and conditions complicating pregnancy, childbirth and the puerperium: Secondary | ICD-10-CM

## 2016-08-21 DIAGNOSIS — M79661 Pain in right lower leg: Secondary | ICD-10-CM | POA: Diagnosis not present

## 2016-08-21 DIAGNOSIS — Z7982 Long term (current) use of aspirin: Secondary | ICD-10-CM | POA: Diagnosis not present

## 2016-08-21 DIAGNOSIS — O26893 Other specified pregnancy related conditions, third trimester: Secondary | ICD-10-CM | POA: Diagnosis not present

## 2016-08-21 NOTE — Progress Notes (Signed)
**  Preliminary report by tech**  Right lower extremity venous duplex completed. There is no evidence of deep or superficial vein thrombosis involving the right lower extremity. All visualized vessels appear patent and compressible. There is no evidence of a Baker's cyst on the right. Results were given to the patient's nurse, Gordy CouncilmanAlexandra.  08/21/16 2:57 PM Olen CordialGreg Shalice Woodring RVT

## 2016-08-21 NOTE — MAU Provider Note (Signed)
History     CSN: [id number]  Arrival date and time: 08/21/16 1122    First Provider Initiated Contact with Patient 08/21/16 1150      Chief Complaint  Patient presents with  . Leg Pain   HPI Martha Navarro is a 25 y.o. G1P0 at 5752w4d who presents with right calf pain. Symptoms began 3 days ago. Reports intermittent calf pain in her right leg that she describes as a "charley horse". Rates pain 6/10. Has not treated. Nothing makes pain better or worse. Has tried stretching out her leg with minimal relief. Denies SOB, chest pain, or history of blood clots. Denies any OB complaints.   OB History    Gravida Para Term Preterm AB Living   1         0   SAB TAB Ectopic Multiple Live Births                  Past Medical History:  Diagnosis Date  . Pneumothorax     Past Surgical History:  Procedure Laterality Date  . NO PAST SURGERIES      Family History  Problem Relation Age of Onset  . Liver cancer Maternal Grandmother     Social History  Substance Use Topics  . Smoking status: Former Smoker    Quit date: 09/01/2011  . Smokeless tobacco: Never Used  . Alcohol use Yes    Allergies: No Known Allergies  Prescriptions Prior to Admission  Medication Sig Dispense Refill Last Dose  . aspirin 81 MG tablet Take 81 mg by mouth daily.   Taking  . Prenatal Vit-Fe Fumarate-FA (PRENATAL MULTIVITAMIN) TABS tablet Take 1 tablet by mouth daily at 12 noon.   Taking  . pseudoephedrine (SUDAFED) 30 MG tablet Take 30 mg by mouth every 4 (four) hours as needed for congestion.       Review of Systems  Constitutional: Negative.   Respiratory: Negative for shortness of breath.   Cardiovascular: Negative for chest pain, palpitations and leg swelling.  Gastrointestinal: Negative.   Genitourinary: Negative.   Musculoskeletal:       + leg pain  Neurological: Negative for dizziness.   Physical Exam   Blood pressure 108/69, pulse 94, temperature 97.6 F (36.4 C), resp. rate 18, height 5'  9" (1.753 m), weight 183 lb 12.8 oz (83.4 kg), last menstrual period 12/09/2015.  Physical Exam  Nursing note and vitals reviewed. Constitutional: She is oriented to person, place, and time. She appears well-developed and well-nourished. No distress.  HENT:  Head: Normocephalic and atraumatic.  Eyes: Conjunctivae are normal. Right eye exhibits no discharge. Left eye exhibits no discharge. No scleral icterus.  Neck: Normal range of motion.  Cardiovascular: Normal rate, regular rhythm and normal heart sounds.   No murmur heard. Pulses:      Dorsalis pedis pulses are 2+ on the right side, and 2+ on the left side.  Respiratory: Effort normal and breath sounds normal. No respiratory distress. She has no wheezes.  Musculoskeletal: She exhibits edema (mild edem of BLE equally, no pitting). She exhibits no tenderness.       Right lower leg: Normal.  LLE 14.5 in, RLE 15 in  Neurological: She is alert and oriented to person, place, and time. She has normal reflexes.  Skin: Skin is warm and dry. She is not diaphoretic.  Psychiatric: She has a normal mood and affect. Her behavior is normal. Judgment and thought content normal.   Fetal Tracing:  Baseline: 140 Variability: moderate Accelerations:  15x15 Decelerations: none  Toco: none MAU Course  Procedures No results found for this or any previous visit (from the past 24 hour(s)).  MDM Category 1 tracing VSS Venous doppler study of right LE; negative for DVT Dr. Richardson Doppole notified of exam & results of doppler study; ok to discharge home  Assessment and Plan  A: 1. Right calf pain    P: Discharge home Take tylenol prn pain Apply heat to affected area F/u with office as scheduled Discussed reasons to return to MAU  Judeth HornErin Brittnei Jagiello 08/21/2016, 11:50 AM

## 2016-08-21 NOTE — MAU Note (Signed)
Right leg pain x 3 days.   Denies n/v/d.

## 2016-08-21 NOTE — Discharge Instructions (Signed)
Heat Therapy  Heat therapy can help ease sore, stiff, injured, and tight muscles and joints. Heat relaxes your muscles, which may help ease your pain.   RISKS AND COMPLICATIONS  If you have any of the following conditions, do not use heat therapy unless your health care provider has approved:   Poor circulation.   Healing wounds or scarred skin in the area being treated.   Diabetes, heart disease, or high blood pressure.   Not being able to feel (numbness) the area being treated.   Unusual swelling of the area being treated.   Active infections.   Blood clots.   Cancer.   Inability to communicate pain. This may include young children and people who have problems with their brain function (dementia).   Pregnancy.  Heat therapy should only be used on old, pre-existing, or long-lasting (chronic) injuries. Do not use heat therapy on new injuries unless directed by your health care provider.  HOW TO USE HEAT THERAPY  There are several different kinds of heat therapy, including:   Moist heat pack.   Warm water bath.   Hot water bottle.   Electric heating pad.   Heated gel pack.   Heated wrap.   Electric heating pad.  Use the heat therapy method suggested by your health care provider. Follow your health care provider's instructions on when and how to use heat therapy.  GENERAL HEAT THERAPY RECOMMENDATIONS   Do not sleep while using heat therapy. Only use heat therapy while you are awake.   Your skin may turn pink while using heat therapy. Do not use heat therapy if your skin turns red.   Do not use heat therapy if you have new pain.   High heat or long exposure to heat can cause burns. Be careful when using heat therapy to avoid burning your skin.   Do not use heat therapy on areas of your skin that are already irritated, such as with a rash or sunburn.  SEEK MEDICAL CARE IF:   You have blisters, redness, swelling, or numbness.   You have new pain.   Your pain is worse.  MAKE SURE  YOU:   Understand these instructions.   Will watch your condition.   Will get help right away if you are not doing well or get worse.     This information is not intended to replace advice given to you by your health care provider. Make sure you discuss any questions you have with your health care provider.     Document Released: 01/02/2012 Document Revised: 10/31/2014 Document Reviewed: 12/03/2013  [employer]vier Interactive Patient Education 2016 [employer]

## 2016-09-06 LAB — OB RESULTS CONSOLE GBS: GBS: NEGATIVE

## 2016-09-07 ENCOUNTER — Inpatient Hospital Stay (HOSPITAL_COMMUNITY)
Admission: AD | Admit: 2016-09-07 | Discharge: 2016-09-07 | Disposition: A | Discharge status: Home / Self Care | Payer: Medicaid Other | Source: Ambulatory Visit | Attending: Obstetrics & Gynecology | Admitting: Obstetrics & Gynecology

## 2016-09-07 ENCOUNTER — Encounter (HOSPITAL_COMMUNITY): Payer: Self-pay

## 2016-09-07 DIAGNOSIS — Z87891 Personal history of nicotine dependence: Secondary | ICD-10-CM | POA: Diagnosis not present

## 2016-09-07 DIAGNOSIS — K529 Noninfective gastroenteritis and colitis, unspecified: Secondary | ICD-10-CM | POA: Insufficient documentation

## 2016-09-07 DIAGNOSIS — O26893 Other specified pregnancy related conditions, third trimester: Secondary | ICD-10-CM | POA: Insufficient documentation

## 2016-09-07 DIAGNOSIS — R1110 Vomiting, unspecified: Secondary | ICD-10-CM | POA: Diagnosis present

## 2016-09-07 DIAGNOSIS — Z79899 Other long term (current) drug therapy: Secondary | ICD-10-CM | POA: Diagnosis not present

## 2016-09-07 DIAGNOSIS — Z3A37 37 weeks gestation of pregnancy: Secondary | ICD-10-CM | POA: Diagnosis not present

## 2016-09-07 HISTORY — DX: Other specified health status: Z78.9

## 2016-09-07 LAB — URINALYSIS, ROUTINE W REFLEX MICROSCOPIC
BILIRUBIN URINE: NEGATIVE
GLUCOSE, UA: NEGATIVE mg/dL
Hgb urine dipstick: NEGATIVE
Leukocytes, UA: NEGATIVE
Nitrite: NEGATIVE
PH: 6 (ref 5.0–8.0)
PROTEIN: NEGATIVE mg/dL
Specific Gravity, Urine: >1.03 — ABNORMAL HIGH (ref 1.005–1.030)

## 2016-09-07 MED ORDER — ONDANSETRON HCL 4 MG/2ML IJ SOLN
4.0000 mg | Freq: Once | INTRAMUSCULAR | Status: AC
Start: 2016-09-07 — End: 2016-09-07
  Administered 2016-09-07: 4 mg via INTRAVENOUS
  Filled 2016-09-07: qty 2

## 2016-09-07 MED ORDER — DEXTROSE 5 % IN LACTATED RINGERS IV BOLUS
1000.0000 mL | Freq: Once | INTRAVENOUS | Status: AC
  Administered 2016-09-07: 1000 mL via INTRAVENOUS

## 2016-09-07 MED ORDER — LACTATED RINGERS IV BOLUS (SEPSIS)
1000.0000 mL | Freq: Once | INTRAVENOUS | Status: AC
  Administered 2016-09-07: 1000 mL via INTRAVENOUS

## 2016-09-07 MED ORDER — PROMETHAZINE HCL 25 MG/ML IJ SOLN
25.0000 mg | Freq: Once | INTRAMUSCULAR | Status: AC
  Administered 2016-09-07: 25 mg via INTRAVENOUS
  Filled 2016-09-07: qty 1

## 2016-09-07 MED ORDER — PROMETHAZINE HCL 12.5 MG PO TABS: 12.5000 mg | ORAL_TABLET | Freq: Four times a day (QID) | ORAL | 0 refills | Status: DC | PRN

## 2016-09-07 MED ORDER — ONDANSETRON 4 MG PO TBDP: 4.0000 mg | ORAL_TABLET | Freq: Three times a day (TID) | ORAL | 0 refills | Status: DC | PRN

## 2016-09-07 MED ORDER — GI COCKTAIL ~~LOC~~
30.0000 mL | Freq: Once | ORAL | Status: AC
  Administered 2016-09-07: 30 mL via ORAL
  Filled 2016-09-07: qty 30

## 2016-09-07 MED ORDER — ONDANSETRON 4 MG PO TBDP: 4.0000 mg | ORAL_TABLET | Freq: Once | ORAL | Status: DC

## 2016-09-07 MED ORDER — FAMOTIDINE IN NACL 20-0.9 MG/50ML-% IV SOLN
20.0000 mg | Freq: Once | INTRAVENOUS | Status: AC
  Administered 2016-09-07: 20 mg via INTRAVENOUS
  Filled 2016-09-07: qty 50

## 2016-09-07 NOTE — Discharge Instructions (Signed)
Norovirus Infection °A norovirus infection is caused by exposure to a virus in a group of similar viruses (noroviruses). This type of infection causes inflammation in your stomach and intestines (gastroenteritis). Norovirus is the most common cause of gastroenteritis. It also causes food poisoning. °Anyone can get a norovirus infection. It spreads very easily (contagious). You can get it from contaminated food, water, surfaces, or other people. Norovirus is found in the stool or vomit of infected people. You can spread the infection as soon as you feel sick until 2 weeks after you recover.  °Symptoms usually begin within 2 days after you become infected. Most norovirus symptoms affect the digestive system. °CAUSES °Norovirus infection is caused by contact with norovirus. You can catch norovirus if you: °· Eat or drink something contaminated with norovirus. °· Touch surfaces or objects contaminated with norovirus and then put your hand in your mouth. °· Have direct contact with an infected person who has symptoms. °· Share food, drink, or utensils with someone with who is sick with norovirus. °SIGNS AND SYMPTOMS °Symptoms of norovirus may include: °· Nausea. °· Vomiting. °· Diarrhea. °· Stomach cramps. °· Fever. °· Chills. °· Headache. °· Muscle aches. °· Tiredness. °DIAGNOSIS °Your health care provider may suspect norovirus based on your symptoms and physical exam. Your health care provider may also test a sample of your stool or vomit for the virus.  °TREATMENT °There is no specific treatment for norovirus. Most people get better without treatment in about 2 days. °HOME CARE INSTRUCTIONS °· Replace lost fluids by drinking plenty of water or rehydration fluids containing important minerals called electrolytes. This prevents dehydration. Drink enough fluid to keep your urine clear or pale yellow. °· Do not prepare food for others while you are infected. Wait at least 3 days after recovering from the illness to do  that. °PREVENTION  °· Wash your hands often, especially after using the toilet or changing a diaper. °· Wash fruits and vegetables thoroughly before preparing or serving them. °· Throw out any food that a sick person may have touched. °· Disinfect contaminated surfaces immediately after someone in the household has been sick. Use a bleach-based household cleaner. °· Immediately remove and wash soiled clothes or sheets. °SEEK MEDICAL CARE IF: °· Your vomiting, diarrhea, and stomach pain is getting worse. °· Your symptoms of norovirus do not go away after 2-3 days. °SEEK IMMEDIATE MEDICAL CARE IF:  °You develop symptoms of dehydration that do not improve with fluid replacement. This may include: °· Excessive sleepiness. °· Lack of tears. °· Dry mouth. °· Dizziness when standing. °· Weak pulse. °This information is not intended to replace advice given to you by your health care provider. Make sure you discuss any questions you have with your health care provider. °Document Released: 12/31/2002 Document Revised: 10/31/2014 Document Reviewed: 03/20/2014 °Elsevier Interactive Patient Education © 2017 [employer] ° °

## 2016-09-07 NOTE — MAU Note (Signed)
Pt c/o vomiting and diarrhea every 20-30 minutes since 11pm last night. Pt states she thinks she ate something bad. Pt states she is unable to keep water down. Pt states baby is moving normally. Pt denies contractions, leaking, and bleeding.

## 2016-09-07 NOTE — MAU Provider Note (Signed)
History     CSN: [id number]  Arrival date and time: 09/07/16 [id number]   First Provider Initiated Contact with Patient 09/07/16 0940      Chief Complaint  Patient presents with  . Emesis  . Diarrhea   HPI   Martha Navarro is a 25 y.o. female G1P0 @ 431w0d here in MAU with N/V/D. The symptoms started last night around 11pm. She has had 20+ episodes of vomiting and diarrhea. Her father woke up with the same symptoms this morning.   + fetal movement. Denies vaginal bleeding or leaking of fluid  OB History    Gravida Para Term Preterm AB Living   1         0   SAB TAB Ectopic Multiple Live Births                  Past Medical History:  Diagnosis Date  . Medical history non-contributory   . Pneumothorax     Past Surgical History:  Procedure Laterality Date  . NO PAST SURGERIES      Family History  Problem Relation Age of Onset  . Liver cancer Maternal Grandmother     Social History  Substance Use Topics  . Smoking status: Former Smoker    Quit date: 09/01/2011  . Smokeless tobacco: Never Used  . Alcohol use No    Allergies: No Known Allergies  Prescriptions Prior to Admission  Medication Sig Dispense Refill Last Dose  . levothyroxine (SYNTHROID, LEVOTHROID) 75 MCG tablet Take 1 tablet by mouth daily.   09/06/2016 at Unknown time  . pantoprazole (PROTONIX) 40 MG tablet Take 40 mg by mouth at bedtime as needed (heartburn).    09/06/2016 at Unknown time  . Prenatal Vit-Fe Fumarate-FA (PRENATAL MULTIVITAMIN) TABS tablet Take 1 tablet by mouth daily at 12 noon.    09/06/2016 at Unknown time  . valACYclovir (VALTREX) 1000 MG tablet Take 1,000 mg by mouth daily.    09/06/2016 at Unknown time    Results for orders placed or performed during the hospital encounter of 09/07/16 (from the past 24 hour(s))  Urinalysis, Routine w reflex microscopic (not [facility])     Status: Abnormal   Collection Time: 09/07/16  9:00 AM  Result Value Ref Range   Color, Urine YELLOW YELLOW    APPearance CLEAR CLEAR   Specific Gravity, Urine >1.030 (H) 1.005 - 1.030   pH 6.0 5.0 - 8.0   Glucose, UA NEGATIVE NEGATIVE mg/dL   Hgb urine dipstick NEGATIVE NEGATIVE   Bilirubin Urine NEGATIVE NEGATIVE   Ketones, ur >80 (A) NEGATIVE mg/dL   Protein, ur NEGATIVE NEGATIVE mg/dL   Nitrite NEGATIVE NEGATIVE   Leukocytes, UA NEGATIVE NEGATIVE    Review of Systems  Constitutional: Negative for chills and fever.  Gastrointestinal: Positive for diarrhea, heartburn, nausea and vomiting.   Physical Exam   Blood pressure 107/58, pulse 98, temperature 97.6 F (36.4 C), temperature source Oral, resp. rate 18, height 5\' 9"  (1.753 m), weight 180 lb 12 oz (82 kg), last menstrual period 12/09/2015, SpO2 100 %.  Physical Exam  Constitutional: She is oriented to person, place, and time. She appears well-developed and well-nourished. No distress.  HENT:  Head: Normocephalic.  Eyes: Pupils are equal, round, and reactive to light.  GI: Soft.  Musculoskeletal: Normal range of motion.  Neurological: She is alert and oriented to person, place, and time.  Skin: Skin is warm. She is not diaphoretic.  Psychiatric: Her behavior is normal.   Fetal Tracing:  Baseline: 150 bpm  Variability: Moderate  Accelerations: 15x15 Decelerations: quick variables  Toco: none   MAU Course  Procedures  None  MDM  UA shows > 80 ketones in her urine, maternal tachycardia @ 120's D5LR bolus X 1 LR bolus X 1 Gi cocktail  Phenergan 25 mg IV Pepcid 20 mg IV  Po challenge: Pass  Dr. Sallye OberKulwa at bedside to see patient.  Pulse down to 98  Assessment and Plan   A:  1. Gastroenteritis, acute     P:  Discharge home in stable condition  Follow up with OB as scheduled Rx: Phenergan, Zofran Return to MAU as needed, if symptoms worsen    Duane LopeJennifer I Wylodean Shimmel, NP 09/07/2016 5:42 PM

## 2016-09-29 ENCOUNTER — Inpatient Hospital Stay (HOSPITAL_COMMUNITY): Payer: Medicaid Other | Admitting: Anesthesiology

## 2016-09-29 ENCOUNTER — Inpatient Hospital Stay (HOSPITAL_COMMUNITY)
Admission: AD | Admit: 2016-09-29 | Discharge: 2016-10-01 | DRG: 774 | Disposition: A | Discharge status: Home / Self Care | Payer: Medicaid Other | Source: Ambulatory Visit | Attending: Obstetrics and Gynecology | Admitting: Obstetrics and Gynecology

## 2016-09-29 ENCOUNTER — Encounter (HOSPITAL_COMMUNITY): Payer: Self-pay | Admitting: *Deleted

## 2016-09-29 DIAGNOSIS — O9832 Other infections with a predominantly sexual mode of transmission complicating childbirth: Secondary | ICD-10-CM | POA: Diagnosis present

## 2016-09-29 DIAGNOSIS — Z87891 Personal history of nicotine dependence: Secondary | ICD-10-CM | POA: Diagnosis not present

## 2016-09-29 DIAGNOSIS — R7989 Other specified abnormal findings of blood chemistry: Secondary | ICD-10-CM

## 2016-09-29 DIAGNOSIS — A6000 Herpesviral infection of urogenital system, unspecified: Secondary | ICD-10-CM | POA: Diagnosis present

## 2016-09-29 DIAGNOSIS — O9902 Anemia complicating childbirth: Secondary | ICD-10-CM | POA: Diagnosis present

## 2016-09-29 DIAGNOSIS — Z23 Encounter for immunization: Secondary | ICD-10-CM | POA: Diagnosis not present

## 2016-09-29 DIAGNOSIS — D649 Anemia, unspecified: Secondary | ICD-10-CM | POA: Diagnosis present

## 2016-09-29 DIAGNOSIS — Z3493 Encounter for supervision of normal pregnancy, unspecified, third trimester: Secondary | ICD-10-CM | POA: Diagnosis present

## 2016-09-29 DIAGNOSIS — Z3A40 40 weeks gestation of pregnancy: Secondary | ICD-10-CM

## 2016-09-29 DIAGNOSIS — Z789 Other specified health status: Secondary | ICD-10-CM

## 2016-09-29 DIAGNOSIS — O99019 Anemia complicating pregnancy, unspecified trimester: Secondary | ICD-10-CM

## 2016-09-29 DIAGNOSIS — B009 Herpesviral infection, unspecified: Secondary | ICD-10-CM

## 2016-09-29 LAB — CBC
HCT: 29.6 % — ABNORMAL LOW (ref 36.0–46.0)
Hemoglobin: 9.7 g/dL — ABNORMAL LOW (ref 12.0–15.0)
MCH: 28.5 pg (ref 26.0–34.0)
MCHC: 32.8 g/dL (ref 30.0–36.0)
MCV: 87.1 fL (ref 78.0–100.0)
PLATELETS: 284 10*3/uL (ref 150–400)
RBC: 3.4 MIL/uL — ABNORMAL LOW (ref 3.87–5.11)
RDW: 13.9 % (ref 11.5–15.5)
WBC: 11.2 10*3/uL — AB (ref 4.0–10.5)

## 2016-09-29 LAB — RPR: RPR Ser Ql: NONREACTIVE

## 2016-09-29 LAB — TYPE AND SCREEN
ABO/RH(D): O POS
ANTIBODY SCREEN: NEGATIVE

## 2016-09-29 LAB — POCT FERN TEST: POCT FERN TEST: POSITIVE

## 2016-09-29 LAB — ABO/RH: ABO/RH(D): O POS

## 2016-09-29 MED ORDER — OXYTOCIN 40 UNITS IN LACTATED RINGERS INFUSION - SIMPLE MED
1.0000 m[IU]/min | INTRAVENOUS | Status: DC
  Administered 2016-09-29: 2 m[IU]/min via INTRAVENOUS
  Filled 2016-09-29: qty 1000

## 2016-09-29 MED ORDER — BUTORPHANOL TARTRATE 1 MG/ML IJ SOLN
1.0000 mg | INTRAMUSCULAR | Status: DC | PRN
  Administered 2016-09-29: 1 mg via INTRAVENOUS
  Filled 2016-09-29 (×2): qty 1

## 2016-09-29 MED ORDER — SOD CITRATE-CITRIC ACID 500-334 MG/5ML PO SOLN: 30.0000 mL | ORAL | Status: DC | PRN

## 2016-09-29 MED ORDER — EPHEDRINE 5 MG/ML INJ: 10.0000 mg | INTRAVENOUS | Status: DC | PRN

## 2016-09-29 MED ORDER — LACTATED RINGERS IV SOLN
INTRAVENOUS | Status: DC
  Administered 2016-09-29 (×3): via INTRAVENOUS

## 2016-09-29 MED ORDER — LIDOCAINE HCL (PF) 1 % IJ SOLN
INTRAMUSCULAR | Status: DC | PRN
  Administered 2016-09-29: 6 mL
  Administered 2016-09-29: 4 mL

## 2016-09-29 MED ORDER — PROMETHAZINE HCL 25 MG/ML IJ SOLN
12.5000 mg | Freq: Once | INTRAMUSCULAR | Status: AC
Start: 2016-09-29 — End: 2016-09-29
  Administered 2016-09-29: 12.5 mg via INTRAVENOUS

## 2016-09-29 MED ORDER — LACTATED RINGERS IV SOLN
500.0000 mL | Freq: Once | INTRAVENOUS | Status: AC
  Administered 2016-09-29: 500 mL via INTRAVENOUS

## 2016-09-29 MED ORDER — OXYCODONE-ACETAMINOPHEN 5-325 MG PO TABS: 2.0000 | ORAL_TABLET | ORAL | Status: DC | PRN

## 2016-09-29 MED ORDER — OXYCODONE-ACETAMINOPHEN 5-325 MG PO TABS
1.0000 | ORAL_TABLET | ORAL | Status: DC | PRN
Start: 2016-09-29 — End: 2016-09-30

## 2016-09-29 MED ORDER — DIPHENHYDRAMINE HCL 50 MG/ML IJ SOLN
12.5000 mg | INTRAMUSCULAR | Status: DC | PRN
Start: 2016-09-29 — End: 2016-09-30

## 2016-09-29 MED ORDER — PANTOPRAZOLE SODIUM 40 MG PO TBEC: 40.0000 mg | DELAYED_RELEASE_TABLET | Freq: Every day | ORAL | Status: DC | PRN

## 2016-09-29 MED ORDER — PHENYLEPHRINE 40 MCG/ML (10ML) SYRINGE FOR IV PUSH (FOR BLOOD PRESSURE SUPPORT): 80.0000 ug | PREFILLED_SYRINGE | INTRAVENOUS | Status: DC | PRN

## 2016-09-29 MED ORDER — OXYTOCIN BOLUS FROM INFUSION
500.0000 mL | Freq: Once | INTRAVENOUS | Status: AC
  Administered 2016-09-29: 500 mL via INTRAVENOUS

## 2016-09-29 MED ORDER — ONDANSETRON HCL 4 MG/2ML IJ SOLN: 4.0000 mg | Freq: Four times a day (QID) | INTRAMUSCULAR | Status: DC | PRN

## 2016-09-29 MED ORDER — FENTANYL 2.5 MCG/ML BUPIVACAINE 1/10 % EPIDURAL INFUSION (WH - ANES)
14.0000 mL/h | INTRAMUSCULAR | Status: DC | PRN
  Administered 2016-09-29 (×2): 14 mL/h via EPIDURAL
  Filled 2016-09-29 (×2): qty 100

## 2016-09-29 MED ORDER — OXYTOCIN 40 UNITS IN LACTATED RINGERS INFUSION - SIMPLE MED: 2.5000 [IU]/h | INTRAVENOUS | Status: DC

## 2016-09-29 MED ORDER — ACETAMINOPHEN 325 MG PO TABS: 650.0000 mg | ORAL_TABLET | ORAL | Status: DC | PRN

## 2016-09-29 MED ORDER — LACTATED RINGERS IV SOLN: 500.0000 mL | INTRAVENOUS | Status: DC | PRN

## 2016-09-29 MED ORDER — PROMETHAZINE HCL 25 MG/ML IJ SOLN
12.5000 mg | Freq: Four times a day (QID) | INTRAMUSCULAR | Status: DC | PRN
  Filled 2016-09-29: qty 1

## 2016-09-29 MED ORDER — LIDOCAINE HCL (PF) 1 % IJ SOLN
30.0000 mL | INTRAMUSCULAR | Status: DC | PRN
  Administered 2016-09-29: 30 mL via SUBCUTANEOUS
  Filled 2016-09-29: qty 30

## 2016-09-29 MED ORDER — FLEET ENEMA 7-19 GM/118ML RE ENEM: 1.0000 | ENEMA | RECTAL | Status: DC | PRN

## 2016-09-29 MED ORDER — PHENYLEPHRINE 40 MCG/ML (10ML) SYRINGE FOR IV PUSH (FOR BLOOD PRESSURE SUPPORT)
80.0000 ug | PREFILLED_SYRINGE | INTRAVENOUS | Status: DC | PRN
  Filled 2016-09-29: qty 10

## 2016-09-29 MED ORDER — TERBUTALINE SULFATE 1 MG/ML IJ SOLN: 0.2500 mg | Freq: Once | INTRAMUSCULAR | Status: DC | PRN

## 2016-09-29 MED ORDER — LEVOTHYROXINE SODIUM 75 MCG PO TABS
75.0000 ug | ORAL_TABLET | Freq: Every day | ORAL | Status: DC
  Filled 2016-09-29: qty 1

## 2016-09-29 NOTE — Anesthesia Procedure Notes (Signed)
Epidural Patient location during procedure: OB  Preanesthetic Checklist Completed: patient identified, site marked, surgical consent, pre-op evaluation, timeout performed, IV checked, risks and benefits discussed and monitors and equipment checked  Epidural Patient position: sitting Prep: site prepped and draped and DuraPrep Patient monitoring: continuous pulse ox and blood pressure Approach: midline Location: L3-L4 Injection technique: LOR air  Needle:  Needle type: Tuohy  Needle gauge: 17 G Needle length: 9 cm and 9 Needle insertion depth: 4 cm Catheter type: closed end flexible Catheter size: 19 Gauge Catheter at skin depth: 10 cm Test dose: negative  Assessment Events: blood not aspirated, injection not painful, no injection resistance, negative IV test and no paresthesia  Additional Notes Dosing of Epidural:  1st dose, through catheter .............................................  Xylocaine 40 mg  2nd dose, through catheter, after waiting 3 minutes.........Xylocaine 60 mg    As each dose occurred, patient was free of IV sx; and patient exhibited no evidence of SA injection.  Patient is more comfortable after epidural dosed. Please see RN's note for documentation of vital signs,and FHR which are stable.  Patient reminded not to try to ambulate with numb legs, and that an RN must be present when she attempts to get up.       

## 2016-09-29 NOTE — Anesthesia Pain Management Evaluation Note (Signed)
  CRNA Pain Management Visit Note  Patient: Martha GrahamShannon Navarro, 25 y.o., female  "Hello I am a member of the anesthesia team at [facility]. We have an anesthesia team available at all times to provide care throughout the hospital, including epidural management and anesthesia for C-section. I don't know your plan for the delivery whether it a natural birth, water birth, IV sedation, nitrous supplementation, doula or epidural, but we want to meet your pain goals."   1.Was your pain managed to your expectations on prior hospitalizations?   No prior hospitalizations  2.What is your expectation for pain management during this hospitalization?     Epidural  3.How can we help you reach that goal? epidural  Record the patient's initial score and the patient's pain goal.   Pain: 7  Pain Goal: 8 [facility] wants you to be able to say your pain was always managed very well.  Martha Navarro 09/29/2016

## 2016-09-29 NOTE — MAU Note (Signed)
Pt reports lof since 0200. Pain 3/10 with some cramping and pressure. + FM

## 2016-09-29 NOTE — H&P (Signed)
Martha Navarro is a 25 y.o. female, G1P0 at 40.1 wks by LMP c/w u/s at 6.6 wks, presents w/ c/o feeling a gush of clear fluid aroud 0200, followed by a continuous trickle. Endorses FM. Reports some cramping. Denies VB or recent outbreak/prodrome. Was closed at last exam. States is hungry.  Pregnancy followed at [facility] since 8weeks.  Pregnancy c/b: 1. 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 2. Fetal cardiac arrhythmia - saw MFM 04/22/16, had fetal echo 04/28/16. Normal. Dr. Aida Puffer recommended weekly FHTs for 4 weeks. 3. Intrauterine synechiae - u/s for growth q 4wks starting at 26wks. GROWTH AT 39.6 WKS ON 09/27/16 = EFW 7 LBS 15 OZ (3589 GRAMS; 50%), NORMAL FLUID, LAGGING HC = 10%, LIKELY DUE TO LOW VERTEX POSITION, NORMAL OVARIES.  4. Raised TSH level - Referred to Endocrinologist. Free T4 = 1.0, started on Levothyroxine. 5. Rubella Equivocal-- will offer vaccine pp. 6. H/O spontaneous pneumothorax in 2011-2012--no clear cause, was "chain smoking" at the time.  OB History    Gravida Para Term Preterm AB Living   1         0   SAB TAB Ectopic Multiple Live Births                 Past Medical History:  Diagnosis Date  . Medical history non-contributory   . Pneumothorax    Past Surgical History:  Procedure Laterality Date  . NO PAST SURGERIES     Family History: family history includes Liver cancer in her maternal grandmother. Social History:  reports that she quit smoking about 5 years ago. She has never used smokeless tobacco. She reports that she does not drink alcohol or use drugs.     Maternal Diabetes: No Genetic Screening: Normal 1st and 2nd trimester screens Maternal Ultrasounds/Referrals: Abnormal:  Findings:   Other: Fetal cardiac arrhythmia - NORMAL ECHO; Intrauterine synechiae - LAST GROWTH AND BPP NORMAL Fetal Ultrasounds or other Referrals:  Fetal echo Maternal Substance Abuse:  No Significant Maternal Medications:  Meds  include: Other:  Levothyroxine 75 mcg tablet daily, Valtrex 500 mg bid, PNV, Protonix 40 mg daily Significant Maternal Lab Results:  Lab values include: Group B Strep negative, Other: Hbg 10.7 at 28 wks Other Comments:  Tdap 07/05/16, Flu 08/03/16  ROS 10 Systems reviewed and are negative for acute change except as noted in the HPI.  History  Exam  Speculum Exam: Vulva, vagina and cervix normal without HSV lesions. +pooling of clear fluid noted.  Dilation: 1 Effacement (%): Thick, VERY POSTERIOR. Pelvis feels adequate for TOL Exam by: K. Spring Hope, CNM @ 3005  Cephalic by Curlene Labrum maneuvers, EFW 8 lbs  Blood pressure 119/75, pulse 90, temperature 97.9 F (36.6 C), temperature source Oral, resp. rate 18, height 5' 9"  (1.753 m), weight 85.3 kg (188 lb), last menstrual period 12/09/2015, SpO2 100 %.  Physical Exam  Nursing noteand vitalsreviewed. Neurological: She has normal reflexes.  Constitutional: She is oriented to person, place, and time. She appears well-developed and well-nourished. HENT:  Head: Normocephalic and atraumatic.  Neck: Normal range of motion.  Cardiovascular: Normal rate, regular rhythm and normal heart sounds.  Respiratory: Effort normal and breath sounds normal.  GI: Soft. Bowel sounds are normal. Abdomen is gravid.  Skin: Warm and dry.  Musculoskeletal: Exhibits trace lower extremity edema. Psychiatric: She has a normal mood and affect. Her behavior is normal.   FHT: BL 130 w/ moderate variability, +accels, occ early, no lates or obvious variables  TOCO: Rare, then every 4 1/2 - 7 min, palpate mild  Prenatal labs: ABO, Rh: --/--/O POS (12/07 0422) Antibody: NEG (12/07 0422) Rubella: Equivocal (04/26 0000) RPR: Nonreactive (04/26 0000)  HBsAg: Negative (04/26 0000)  HIV: Non-reactive (04/26 0000)  GBS: Negative (11/14 0000)   Results for orders placed or performed during the hospital encounter of 09/29/16 (from the past 24 hour(s))  Fern Test      Status: Normal   Collection Time: 09/29/16  3:43 AM  Result Value Ref Range   POCT Fern Test Positive = ruptured amniotic membanes   CBC     Status: Abnormal   Collection Time: 09/29/16  4:22 AM  Result Value Ref Range   WBC 11.2 (H) 4.0 - 10.5 K/uL   RBC 3.40 (L) 3.87 - 5.11 MIL/uL   Hemoglobin 9.7 (L) 12.0 - 15.0 g/dL   HCT 29.6 (L) 36.0 - 46.0 %   MCV 87.1 78.0 - 100.0 fL   MCH 28.5 26.0 - 34.0 pg   MCHC 32.8 30.0 - 36.0 g/dL   RDW 13.9 11.5 - 15.5 %   Platelets 284 150 - 400 K/uL  Type and screen [place]     Status: None   Collection Time: 09/29/16  4:22 AM  Result Value Ref Range   ABO/RH(D) O POS    Antibody Screen NEG    Sample Expiration 10/02/2016     Assessment: IUP at 40.1 wks Latent phase labor SROM x 5 1/2 hrs; no s/s of infection FWB: Cat 1 GBS neg Anemia - admission Hbg 9.7 Rubella equivocal Type 1 genital HSV Elevated TSH level  Plan: Admit to [facility]. Routine [facility] orders.  May have regular diet for breakfast, then clears until after delivery. Expectant management for now. Plan of care reviewed w/ pt - will re-evaluate cvx prn. If no change at next eval, will augment w/ po Cytotec or begin Pitocin augmentation; pt in agreement. Pain med/epidural prn. Iron/colace pp. Offer MMR pp. Continue Levothyroxine pp (ordered). Declines dose this a.m. Expect SVD.      Farrel Gordon 09/29/2016, 7:24 AM

## 2016-09-29 NOTE — Anesthesia Preprocedure Evaluation (Signed)
Anesthesia Evaluation  Patient identified by MRN, date of birth, ID band Patient awake    Reviewed: Allergy & Precautions, H&P , Patient's Chart, lab work & pertinent test results  Airway Mallampati: II  TM Distance: >3 FB Neck ROM: full    Dental  (+) Teeth Intact   Pulmonary former smoker,  breath sounds clear to auscultation        Cardiovascular Rhythm:regular Rate:Normal     Neuro/Psych    GI/Hepatic   Endo/Other    Renal/GU      Musculoskeletal   Abdominal   Peds  Hematology  (+) anemia ,   Anesthesia Other Findings       Reproductive/Obstetrics (+) Pregnancy                             Anesthesia Physical Anesthesia Plan  ASA: II  Anesthesia Plan: Epidural   Post-op Pain Management:    Induction:   Airway Management Planned:   Additional Equipment:   Intra-op Plan:   Post-operative Plan:   Informed Consent: I have reviewed the patients History and Physical, chart, labs and discussed the procedure including the risks, benefits and alternatives for the proposed anesthesia with the patient or authorized representative who has indicated his/her understanding and acceptance.   Dental Advisory Given  Plan Discussed with:   Anesthesia Plan Comments: (Labs checked- platelets confirmed with RN in room. Fetal heart tracing, per RN, reported to be stable enough for sitting procedure. Discussed epidural, and patient consents to the procedure:  included risk of possible headache,backache, failed block, allergic reaction, and nerve injury. This patient was asked if she had any questions or concerns before the procedure started.)        Anesthesia Quick Evaluation  

## 2016-09-29 NOTE — Progress Notes (Signed)
LABOR PROGRESS NOTE  Martha GrahamShannon Navarro is a 25 y.o. G1P0 at 5749w1d  admitted for SROM/Labor  Subjective: Comfortable with epidural; feeling pressure  Objective: BP 115/80 (BP Location: Right Arm)   Pulse 83   Temp 98.1 F (36.7 C) (Oral)   Resp 18   Ht 5\' 9"  (1.753 m)   Wt 188 lb (85.3 kg)   LMP 12/09/2015 (Approximate)   SpO2 100%   BMI 27.76 kg/m  or  Vitals:   09/29/16 1730 09/29/16 1800 09/29/16 1830 09/29/16 1900  BP: 117/74 110/75 113/76 115/80  Pulse: 95 90 91 83  Resp: 16 18 18 18   Temp:    98.1 F (36.7 C)  TempSrc:    Oral  SpO2:      Weight:      Height:         Dilation: 4 Effacement (%): 90 Cervical Position: Posterior Station: -1 Presentation: Vertex Exam by:: Dillard MD  Labs: Lab Results  Component Value Date   WBC 11.2 (H) 09/29/2016   HGB 9.7 (L) 09/29/2016   HCT 29.6 (L) 09/29/2016   MCV 87.1 09/29/2016   PLT 284 09/29/2016    Patient Active Problem List   Diagnosis Date Noted  . Normal labor 09/29/2016  . 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  . Anemia affecting first pregnancy 09/29/2016  . Elevated TSH level - on Levothyroxine 09/29/2016  . Rubella immune status not known 09/29/2016  . Spontaneous pneumothorax 09/13/2011  . Arthralgia 09/13/2011    Assessment / Plan: [age] y.o. G1P0 at 6049w1d here for SROM/Labor  Labor: Active Fetal Wellbeing:  Cat 1 Pain Control:  epidural Anticipated MOD:  SVD- Dr Su Hiltroberts is specialing this pt  Elmore GuiseLori A Fitzhugh Navarro 09/29/2016, 7:26 PM

## 2016-09-29 NOTE — Progress Notes (Signed)
Patient ID: Martha GrahamShannon Cassetta, female   DOB: 1991/05/30, 25 y.o.   MRN: [id number]  Pt with some pain from contractions BP 118/80 (BP Location: Right Arm)   Pulse 74   Temp 98 F (36.7 C) (Oral)   Resp 16   Ht 5\' 9"  (1.753 m)   Wt 188 lb (85.3 kg)   LMP 12/09/2015 (Approximate)   SpO2 100%   BMI 27.76 kg/m  Cat 1 tracing toco q 4-6 minutes cx 2/70/-2 Stadol and phenergan for pain If no furhter cervical change by 1400 plan to do pitocin augmentation Anticipate SVD

## 2016-09-29 NOTE — Progress Notes (Signed)
Patient ID: Martha GrahamShannon Mairena, female   DOB: Sep 28, 1991, 25 y.o.   MRN: [id number]  Pt comfortable with epidural BP 110/75   Pulse 90   Temp 98.1 F (36.7 C) (Oral)   Resp 18   Ht 5\' 9"  (1.753 m)   Wt 188 lb (85.3 kg)   LMP 12/09/2015 (Approximate)   SpO2 100%   BMI 27.76 kg/m  4/90/-1  cat 1 Anticipate SVD

## 2016-09-29 NOTE — Progress Notes (Signed)
Martha GrahamShannon Navarro is a 25 y.o. G1P0 at 6265w1d   Subjective: Feels pressure in her bottom  Objective: BP 113/69   Pulse 97   Temp 97.7 F (36.5 C) (Oral)   Resp 18   Ht 5\' 9"  (1.753 m)   Wt 188 lb (85.3 kg)   LMP 12/09/2015 (Approximate)   SpO2 100%   BMI 27.76 kg/m  No intake/output data recorded. Total I/O In: -  Out: 525 [Urine:525]  FHT:  FHR: 140s bpm, variability: moderate,  accelerations:  Present,  decelerations:  Absent UC:   regular, every 2-3 minutes SVE:   Dilation: Lip/rim Effacement (%): 90 Station: 0 Exam by:: Darletta MollS. Dixon RN   Labs: Lab Results  Component Value Date   WBC 11.2 (H) 09/29/2016   HGB 9.7 (L) 09/29/2016   HCT 29.6 (L) 09/29/2016   MCV 87.1 09/29/2016   PLT 284 09/29/2016    Assessment / Plan: Augmentation of labor, progressing well  Labor: Progressing normally Preeclampsia:  no signs or symptoms of toxicity Fetal Wellbeing:  Category I Pain Control:  Epidural I/D:  GBS neg Anticipated MOD:  NSVD  Martha Navarro Y 09/29/2016, 9:44 PM

## 2016-09-30 LAB — CBC
HCT: 25.4 % — ABNORMAL LOW (ref 36.0–46.0)
HEMOGLOBIN: 8.6 g/dL — AB (ref 12.0–15.0)
MCH: 29.1 pg (ref 26.0–34.0)
MCHC: 33.9 g/dL (ref 30.0–36.0)
MCV: 85.8 fL (ref 78.0–100.0)
Platelets: 239 10*3/uL (ref 150–400)
RBC: 2.96 MIL/uL — ABNORMAL LOW (ref 3.87–5.11)
RDW: 13.9 % (ref 11.5–15.5)
WBC: 13.2 10*3/uL — ABNORMAL HIGH (ref 4.0–10.5)

## 2016-09-30 MED ORDER — ZOLPIDEM TARTRATE 5 MG PO TABS: 5.0000 mg | ORAL_TABLET | Freq: Every evening | ORAL | Status: DC | PRN

## 2016-09-30 MED ORDER — ONDANSETRON HCL 4 MG PO TABS: 4.0000 mg | ORAL_TABLET | ORAL | Status: DC | PRN

## 2016-09-30 MED ORDER — SIMETHICONE 80 MG PO CHEW
80.0000 mg | CHEWABLE_TABLET | ORAL | Status: DC | PRN
  Administered 2016-09-30: 80 mg via ORAL

## 2016-09-30 MED ORDER — ONDANSETRON HCL 4 MG/2ML IJ SOLN: 4.0000 mg | INTRAMUSCULAR | Status: DC | PRN

## 2016-09-30 MED ORDER — COCONUT OIL OIL: 1.0000 "application " | TOPICAL_OIL | Status: DC | PRN

## 2016-09-30 MED ORDER — SENNOSIDES-DOCUSATE SODIUM 8.6-50 MG PO TABS
2.0000 | ORAL_TABLET | ORAL | Status: DC
  Administered 2016-10-01: 2 via ORAL
  Filled 2016-09-30: qty 2

## 2016-09-30 MED ORDER — WITCH HAZEL-GLYCERIN EX PADS: 1.0000 "application " | MEDICATED_PAD | CUTANEOUS | Status: DC | PRN

## 2016-09-30 MED ORDER — LEVOTHYROXINE SODIUM 75 MCG PO TABS
75.0000 ug | ORAL_TABLET | Freq: Every day | ORAL | Status: DC
  Administered 2016-09-30 – 2016-10-01 (×2): 75 ug via ORAL
  Filled 2016-09-30 (×2): qty 1

## 2016-09-30 MED ORDER — TETANUS-DIPHTH-ACELL PERTUSSIS 5-2.5-18.5 LF-MCG/0.5 IM SUSP: 0.5000 mL | Freq: Once | INTRAMUSCULAR | Status: DC

## 2016-09-30 MED ORDER — ACETAMINOPHEN 325 MG PO TABS: 650.0000 mg | ORAL_TABLET | ORAL | Status: DC | PRN

## 2016-09-30 MED ORDER — BENZOCAINE-MENTHOL 20-0.5 % EX AERO
1.0000 "application " | INHALATION_SPRAY | CUTANEOUS | Status: DC | PRN
  Administered 2016-09-30 – 2016-10-01 (×2): 1 via TOPICAL
  Filled 2016-09-30 (×2): qty 56

## 2016-09-30 MED ORDER — PRENATAL MULTIVITAMIN CH
1.0000 | ORAL_TABLET | Freq: Every day | ORAL | Status: DC
  Administered 2016-09-30 – 2016-10-01 (×2): 1 via ORAL
  Filled 2016-09-30 (×2): qty 1

## 2016-09-30 MED ORDER — DIPHENHYDRAMINE HCL 25 MG PO CAPS: 25.0000 mg | ORAL_CAPSULE | Freq: Four times a day (QID) | ORAL | Status: DC | PRN

## 2016-09-30 MED ORDER — IBUPROFEN 600 MG PO TABS
600.0000 mg | ORAL_TABLET | Freq: Four times a day (QID) | ORAL | Status: DC
  Administered 2016-09-30 – 2016-10-01 (×6): 600 mg via ORAL
  Filled 2016-09-30 (×6): qty 1

## 2016-09-30 MED ORDER — DIBUCAINE 1 % RE OINT
1.0000 "application " | TOPICAL_OINTMENT | RECTAL | Status: DC | PRN
  Administered 2016-10-01: 1 via RECTAL
  Filled 2016-09-30: qty 28

## 2016-09-30 NOTE — Progress Notes (Signed)
UR chart review completed.  

## 2016-09-30 NOTE — Lactation Note (Signed)
This note was copied from a baby's chart. Lactation Consultation Note  Patient Name: Martha Navarro [id number]'s Date: 09/30/2016 Reason for consult: Follow-up assessmentwith this first time mom and term baby, now 7114 hours old. I showed mom how to position baby for cross cradle hold, and mom could feel this latch was more comfortable. Mom's nipple after cradle hold was pinched/slanted, and  latach was shallow. Mom knows to keep using cross cradle until baby is able to latch on her own. Mom will call  Questions/concerns.     Maternal Data Formula Feeding for Exclusion: No Has patient been taught Hand Expression?: Yes Does the patient have breastfeeding experience prior to this delivery?: No  Feeding Feeding Type: Breast Fed  LATCH Score/Interventions Latch: Grasps breast easily, tongue down, lips flanged, rhythmical sucking. Intervention(s): Assist with latch  Audible Swallowing: A few with stimulation  Type of Nipple: Everted at rest and after stimulation  Comfort (Breast/Nipple): Soft / non-tender     Hold (Positioning): Assistance needed to correctly position infant at breast and maintain latch.  LATCH Score: 8  Lactation Tools Discussed/Used     Consult Status Consult Status: Follow-up Date: 10/01/16 Follow-up type: In-patient    Alfred LevinsLee, Milea Klink Anne 09/30/2016, 1:21 PM

## 2016-09-30 NOTE — Progress Notes (Signed)
Subjective: Postpartum Day 1: Vaginal delivery, 2nd laceration Patient up ad lib, reports no syncope or dizziness. Feeding:  Breast Contraceptive plan:  undecided  Objective: Vital signs in last 24 hours: Temp:  [97.7 F (36.5 C)-98.2 F (36.8 C)] 97.9 F (36.6 C) (12/08 0640) Pulse Rate:  [74-110] 85 (12/08 0640) Resp:  [16-18] 18 (12/08 0640) BP: (110-144)/(55-94) 124/74 (12/08 0640) SpO2:  [100 %] 100 % (12/07 1506)  Physical Exam:  General: alert, cooperative and no distress Lochia: appropriate Uterine Fundus: firm Perineum: healing well DVT Evaluation: No evidence of DVT seen on physical exam. Negative Homan's sign.   CBC Latest Ref Rng & Units 09/30/2016 09/29/2016  WBC 4.0 - 10.5 K/uL 13.2(H) 11.2(H)  Hemoglobin 12.0 - 15.0 g/dL 1.1(B8.6(L) 1.4(N9.7(L)  Hematocrit 36.0 - 46.0 % 25.4(L) 29.6(L)  Platelets 150 - 400 K/uL 239 284     Assessment/Plan: Status post vaginal delivery day 1. Stable Continue current care. Plan for discharge tomorrow and Breastfeeding    Henderson Newcomerancy Jean ProtheroCNM 09/30/2016, 9:10 AM

## 2016-09-30 NOTE — Anesthesia Postprocedure Evaluation (Signed)
Anesthesia Post Note  Patient: Martha GrahamShannon Cyphers  Procedure(s) Performed: * No procedures listed *  Patient location during evaluation: Mother Baby Anesthesia Type: Epidural Level of consciousness: awake Pain management: pain level controlled Vital Signs Assessment: post-procedure vital signs reviewed and stable Respiratory status: spontaneous breathing Cardiovascular status: stable Postop Assessment: no headache, no backache, epidural receding and patient able to bend at knees Anesthetic complications: no     Last Vitals:  Vitals:   09/30/16 0240 09/30/16 0640  BP: 121/69 124/74  Pulse: 91 85  Resp: 18 18  Temp: 36.8 C 36.6 C    Last Pain:  Vitals:   09/30/16 0640  TempSrc: Oral  PainSc:    Pain Goal: Patients Stated Pain Goal: 3 (09/29/16 1900)               Edison PaceWILKERSON,Deannie Resetar

## 2016-09-30 NOTE — Lactation Note (Signed)
This note was copied from a baby's chart. Lactation Consultation Note  Patient Name: Martha Navarro QMVHQ'IToday's Date: 09/30/2016 Reason for consult: Initial assessment   With this first time mom and term baby, now 6211 hours old, and asleep  Skin to skin with mom, after her bath. I reviewed lactation and breastfeeding teaching with mom, from the Baby and Me book, showed mom how to hand express, and asked mom to call for lactation when baby next shows cues, for her latch to be observed by lactation. Mom and MGM very receptive to teaching, and know to call for questions, concerns.    Maternal Data Formula Feeding for Exclusion: No Has patient been taught Hand Expression?: Yes Does the patient have breastfeeding experience prior to this delivery?: No  Feeding Feeding Type: Breast Fed Length of feed: 5 min  LATCH Score/Interventions Latch: Repeated attempts needed to sustain latch, nipple held in mouth throughout feeding, stimulation needed to elicit sucking reflex. Intervention(s): Assist with latch (widen)  Audible Swallowing:  (lots of easily expressed clostrum)  Type of Nipple: Everted at rest and after stimulation  Comfort (Breast/Nipple): Soft / non-tender     Hold (Positioning): Assistance needed to correctly position infant at breast and maintain latch. Intervention(s): Breastfeeding basics reviewed;Support Pillows;Position options;Skin to skin  LATCH Score: 8  Lactation Tools Discussed/Used     Consult Status Consult Status: Follow-up Date: 09/30/16 Follow-up type: In-patient    Alfred LevinsLee, Shaima Sardinas Anne 09/30/2016, 11:29 AM

## 2016-10-01 NOTE — Discharge Summary (Signed)
OB Discharge Summary     Patient Name: Martha Navarro DOB: 01-Oct-1991 MRN: [id number]  Date of admission: 09/29/2016 Delivering MD: Osborn CohoOBERTS, ANGELA   Date of discharge: 10/01/2016  Admitting diagnosis: 40 weeks possibility of water breaking Intrauterine pregnancy: 5431w1d     Secondary diagnosis:  Principal Problem:   Normal labor Active Problems:   HSV-1 infection (genital)   Anemia affecting first pregnancy   Elevated TSH level - on Levothyroxine   Rubella immune status not known  Additional problems: None     Discharge diagnosis: Term Pregnancy Delivered                                                                                                Post partum procedures:Rubella  Augmentation: Pitocin  Complications: None  Hospital course:  Onset of Labor With Vaginal Delivery     25 y.o. yo G1P1001 at [redacted]w[redacted]d was admitted in Latent Labor on 09/29/2016. Patient had an uncomplicated labor course as follows:  Membrane Rupture Time/Date: 2:00 AM ,09/29/2016   Intrapartum Procedures: Episiotomy: None [1]                                         Lacerations:  2nd degree [3];Perineal [11];Labial [10]  Patient had a delivery of a Viable infant. 09/29/2016  Information for the patient's newborn:  Martha HootsOakley, Girl Martha Navarro [[id number]][[id number]]  Delivery Method: Vag-Spont    Pateint had an uncomplicated postpartum course.  She is ambulating, tolerating a regular diet, passing flatus, and urinating well. Patient is discharged home in stable condition on 10/01/16.    Physical exam Vitals:   09/30/16 0640 09/30/16 1430 09/30/16 1804 10/01/16 0518  BP: 124/74 123/73 125/76 106/62  Pulse: 85 78 73 80  Resp: 18 20 18 18   Temp: 97.9 F (36.6 C) 97.6 F (36.4 C) 98.3 F (36.8 C) 97.7 F (36.5 C)  TempSrc: Oral  Oral Oral  SpO2:      Weight:      Height:       General: alert, cooperative and no distress Lochia: appropriate Uterine Fundus: firm Incision: Healing well with no significant  drainage DVT Evaluation: No evidence of DVT seen on physical exam. Negative Homan's sign. Labs: Lab Results  Component Value Date   WBC 13.2 (H) 09/30/2016   HGB 8.6 (L) 09/30/2016   HCT 25.4 (L) 09/30/2016   MCV 85.8 09/30/2016   PLT 239 09/30/2016   No flowsheet data found.  Discharge instruction: per After Visit Summary and "Baby and Me Booklet".  After visit meds:    Medication List    STOP taking these medications   ondansetron 4 MG disintegrating tablet Commonly known as:  ZOFRAN ODT   promethazine 12.5 MG tablet Commonly known as:  PHENERGAN     TAKE these medications   levothyroxine 75 MCG tablet Commonly known as:  SYNTHROID, LEVOTHROID Take 1 tablet by mouth daily.   pantoprazole 40 MG tablet Commonly known as:  PROTONIX Take 40 mg by mouth at bedtime  as needed (heartburn).   prenatal multivitamin Tabs tablet Take 1 tablet by mouth daily at 12 noon.   valACYclovir 1000 MG tablet Commonly known as:  VALTREX Take 1,000 mg by mouth daily.       Diet: routine diet  Activity: Advance as tolerated. Pelvic rest for 6 weeks.   Outpatient follow up:6 weeks Follow up Appt:No future appointments. Follow up Visit:No Follow-up on file.  Postpartum contraception: Undecided  Newborn Data: Live born female  Birth Weight: 7 lb 15.7 oz (3620 g) APGAR: 9, 10  Baby Feeding: Breast Disposition:home with mother   10/01/2016 Martha Navarro, CNM

## 2016-10-01 NOTE — Lactation Note (Signed)
This note was copied from a baby's chart. Lactation Consultation Note  Patient Name: Girl Liam GrahamShannon Watchman ZOXWR'UToday's Date: 10/01/2016 Reason for consult: Follow-up assessment  Baby is 35 hours old , 3% weight loss, Doc flow sheets WNL.  Mom denies soreness , and breast feel fuller and warmer . Per mom baby cluster fed last night.  Baby latched at consult . Mom needed alittle assist , multiple swallows noted, increased with breast Compressions. Sore nipples and engorgement prevention and tx reviewed.  LC recommended if the baby isn't showing feeding cues by 3 hours to check diaper and place abby STS.  Days and evenings by 2 - 2 1/2 hours and it should decrease the cluster feeding all night.  Per mom has pump at home.  Mom's mom is supportive and breast fed 2 babies.  Mother informed of post-discharge support and given phone number to the lactation department, including services for phone call assistance; out-patient appointments; and breastfeeding support group. List of other breastfeeding resources in the community given in the handout. Encouraged mother to call for problems or concerns related to breastfeeding.   Maternal Data Has patient been taught Hand Expression?: Yes  Feeding Feeding Type: Breast Fed Length of feed:  (swallows noted and still feeding at 10 mins )  LATCH Score/Interventions Latch: Grasps breast easily, tongue down, lips flanged, rhythmical sucking. Intervention(s): Adjust position;Assist with latch;Breast massage;Breast compression  Audible Swallowing: Spontaneous and intermittent  Type of Nipple: Everted at rest and after stimulation  Comfort (Breast/Nipple): Filling, red/small blisters or bruises, mild/mod discomfort  Problem noted: Filling  Hold (Positioning): Assistance needed to correctly position infant at breast and maintain latch. Intervention(s): Breastfeeding basics reviewed;Support Pillows;Position options;Skin to skin  LATCH Score: 8  Lactation  Tools Discussed/Used     Consult Status Consult Status: Complete Date: 10/01/16 Follow-up type: In-patient    Matilde SprangMargaret Ann Brigitta Pricer 10/01/2016, 10:24 AM

## 2016-10-06 ENCOUNTER — Inpatient Hospital Stay (HOSPITAL_COMMUNITY): Payer: Managed Care, Other (non HMO)

## 2017-02-13 ENCOUNTER — Emergency Department (HOSPITAL_COMMUNITY): Payer: Managed Care, Other (non HMO)

## 2017-02-13 ENCOUNTER — Encounter (HOSPITAL_COMMUNITY): Payer: Self-pay | Admitting: Emergency Medicine

## 2017-02-13 ENCOUNTER — Emergency Department (HOSPITAL_COMMUNITY)
Admission: EM | Admit: 2017-02-13 | Discharge: 2017-02-13 | Disposition: A | Discharge status: Home / Self Care | Payer: Managed Care, Other (non HMO) | Attending: Emergency Medicine | Admitting: Emergency Medicine

## 2017-02-13 DIAGNOSIS — Z87891 Personal history of nicotine dependence: Secondary | ICD-10-CM | POA: Insufficient documentation

## 2017-02-13 DIAGNOSIS — J9311 Primary spontaneous pneumothorax: Secondary | ICD-10-CM | POA: Diagnosis not present

## 2017-02-13 DIAGNOSIS — Z79899 Other long term (current) drug therapy: Secondary | ICD-10-CM | POA: Insufficient documentation

## 2017-02-13 DIAGNOSIS — R0602 Shortness of breath: Secondary | ICD-10-CM | POA: Diagnosis present

## 2017-02-13 LAB — BASIC METABOLIC PANEL
ANION GAP: 9 (ref 5–15)
BUN: 10 mg/dL (ref 6–20)
CALCIUM: 9.5 mg/dL (ref 8.9–10.3)
CO2: 21 mmol/L — ABNORMAL LOW (ref 22–32)
CREATININE: 0.55 mg/dL (ref 0.44–1.00)
Chloride: 105 mmol/L (ref 101–111)
GFR calc Af Amer: >60 mL/min (ref >60)
GLUCOSE: 98 mg/dL (ref 65–99)
Potassium: 3.5 mmol/L (ref 3.5–5.1)
Sodium: 135 mmol/L (ref 135–145)

## 2017-02-13 LAB — CBC
HCT: 37.6 % (ref 36.0–46.0)
Hemoglobin: 12.4 g/dL (ref 12.0–15.0)
MCH: 27.8 pg (ref 26.0–34.0)
MCHC: 33 g/dL (ref 30.0–36.0)
MCV: 84.3 fL (ref 78.0–100.0)
PLATELETS: 355 10*3/uL (ref 150–400)
RBC: 4.46 MIL/uL (ref 3.87–5.11)
RDW: 14.1 % (ref 11.5–15.5)
WBC: 9.1 10*3/uL (ref 4.0–10.5)

## 2017-02-13 LAB — I-STAT TROPONIN, ED: TROPONIN I, POC: 0 ng/mL (ref 0.00–0.08)

## 2017-02-13 MED ORDER — HYDROCODONE-ACETAMINOPHEN 5-325 MG PO TABS
1.0000 | ORAL_TABLET | Freq: Once | ORAL | Status: AC
  Administered 2017-02-13: 1 via ORAL
  Filled 2017-02-13: qty 1

## 2017-02-13 MED ORDER — HYDROCODONE-ACETAMINOPHEN 5-325 MG PO TABS: 1.0000 | ORAL_TABLET | Freq: Four times a day (QID) | ORAL | 0 refills | Status: DC | PRN

## 2017-02-13 NOTE — ED Notes (Signed)
Pt verbalized understanding of d/c instructions and has no further questions. Pt is stable, A&Ox4, VSS.  

## 2017-02-13 NOTE — ED Provider Notes (Signed)
[facility]-EMERGENCY DEPT Provider Note   CSN: [id number] Arrival date & time: 02/13/17  1913     History   Chief Complaint Chief Complaint  Patient presents with  . Shortness of Breath    HPI Martha Navarro is a 26 y.o. female.  The history is provided by the patient. No language interpreter was used.  Shortness of Breath     Martha Navarro is a 26 y.o. female who presents to the Emergency Department complaining of back pain, sob.  This afternoon she developed sudden onset left-sided back pain with associated shortness of breath. Symptoms happened while she was at work. She had similar symptoms 5 years ago and had a spontaneous pneumothorax. It did resolve at that time with supplemental oxygen but no surgical procedure. No fevers, cough, abdominal pain, leg swelling or pain. She is 5 months postpartum and is using Depo-Provera for contraceptive. She is not currently breast-feeding. Symptoms are moderate and constant in nature.  Past Medical History:  Diagnosis Date  . Medical history non-contributory   . Pneumothorax     Patient Active Problem List   Diagnosis Date Noted  . Normal labor 09/29/2016  . 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  . Anemia affecting first pregnancy 09/29/2016  . Elevated TSH level - on Levothyroxine 09/29/2016  . Rubella immune status not known 09/29/2016  . Spontaneous pneumothorax 09/13/2011  . Arthralgia 09/13/2011    Past Surgical History:  Procedure Laterality Date  . NO PAST SURGERIES      OB History    Gravida Para Term Preterm AB Living   SAB TAB Ectopic Multiple Live Births         0 1       Home Medications    Prior to Admission medications   Medication Sig Start Date End Date Taking? Authorizing Provider  HYDROcodone-acetaminophen (NORCO) 5-325 MG tablet Take 1 tablet by mouth every 6 (six) hours as needed for moderate pain. 02/13/17   Tilden Fossa, MD  levothyroxine (SYNTHROID, LEVOTHROID) 75 MCG tablet Take 1  tablet by mouth daily. 06/14/16   Historical Provider, MD  pantoprazole (PROTONIX) 40 MG tablet Take 40 mg by mouth at bedtime as needed (heartburn).     Historical Provider, MD  Prenatal Vit-Fe Fumarate-FA (PRENATAL MULTIVITAMIN) TABS tablet Take 1 tablet by mouth daily at 12 noon.     Historical Provider, MD  valACYclovir (VALTREX) 1000 MG tablet Take 1,000 mg by mouth daily.     Historical Provider, MD    Family History Family History  Problem Relation Age of Onset  . Liver cancer Maternal Grandmother     Social History Social History  Substance Use Topics  . Smoking status: Former Smoker    Quit date: 09/01/2011  . Smokeless tobacco: Never Used  . Alcohol use No     Allergies   Patient has no known allergies.   Review of Systems Review of Systems  Respiratory: Positive for shortness of breath.   All other systems reviewed and are negative.    Physical Exam Updated Vital Signs BP 118/79 (BP Location: Right Arm)   Pulse 89   Temp 98.2 F (36.8 C) (Oral)   Resp 16   Ht  (1.778 m)   Wt 164 lb (74.4 kg)   LMP 02/13/2017   SpO2 97%   BMI 23.53 kg/m   Physical Exam  Constitutional: She is oriented to person, place, and time. She appears well-developed and well-nourished.  HENT:  Head: Normocephalic and atraumatic.  Cardiovascular: Normal rate and regular rhythm.   No murmur heard. Pulmonary/Chest: Effort normal. No respiratory distress.  Slightly decreased air movement bilaterally.  Abdominal: Soft. There is no tenderness. There is no rebound and no guarding.  Musculoskeletal: She exhibits no edema or tenderness.  Neurological: She is alert and oriented to person, place, and time.  Skin: Skin is warm and dry.  Multiple Tattoos  Psychiatric: She has a normal mood and affect. Her behavior is normal.  Nursing note and vitals reviewed.    ED Treatments / Results  Labs (all labs ordered are listed, but only abnormal results are displayed) Labs Reviewed    BASIC METABOLIC PANEL - Abnormal; Notable for the following:       Result Value   CO2 21 (*)    All other components within normal limits  CBC  I-STAT TROPOININ, ED    EKG  EKG Interpretation  Date/Time:  Monday February 13 2017 19:18:53 EDT Ventricular Rate:  87 PR Interval:  136 QRS Duration: 72 QT Interval:  338 QTC Calculation: 406 R Axis:   75 Text Interpretation:  Normal sinus rhythm with sinus arrhythmia Normal ECG Confirmed by Lincoln Brigham [phone]) on 02/13/2017 10:03:24 PM       Radiology Dg Chest 2 View  Result Date: 02/13/2017 CLINICAL DATA:  Acute onset of shortness of breath and left scapular pain. Initial encounter. EXAM: CHEST  2 VIEW COMPARISON:  Chest radiograph from 10/07/2011 FINDINGS: A small left apical pneumothorax is noted. The lungs are otherwise clear. No pleural effusion is seen. Accessory azygos lobe is noted. The heart is normal in size. No acute osseous abnormalities are seen. IMPRESSION: Small left apical pneumothorax noted, measuring approximately 15% of left lung volume. This would typically resorb within 10 days. These results were called by telephone at the time of interpretation on 02/13/2017 at 9:03 pm to Dr. Chaney Malling, who verbally acknowledged these results. Electronically Signed   By: Roanna Raider M.D.   On: 02/13/2017 21:03    Procedures Procedures (including critical care time)  Medications Ordered in ED Medications  HYDROcodone-acetaminophen (NORCO/VICODIN) 5-325 MG per tablet 1 tablet (1 tablet Oral Given 02/13/17 2216)     Initial Impression / Assessment and Plan / ED Course  I have reviewed the triage vital signs and the nursing notes.  Pertinent labs & imaging results that were available during my care of the patient were reviewed by me and considered in my medical decision making (see chart for details).     Patient with history of spontaneous pneumothorax here with pleuritic left sided back pain similar to prior pneumothorax.  Chest x-ray does demonstrate 15% pneumothorax with no evidence of company getting features. She has no respiratory distress on examination. Discussed with Dr. Laneta Simmers with CT surgery. Plan to DC patient home with outpatient follow-up tomorrow for repeat chest x-ray. Counseled pt on home care, return precautions.  Final Clinical Impressions(s) / ED Diagnoses   Final diagnoses:  Primary spontaneous pneumothorax    New Prescriptions New Prescriptions   HYDROCODONE-ACETAMINOPHEN (NORCO) 5-325 MG TABLET    Take 1 tablet by mouth every 6 (six) hours as needed for moderate pain.     Tilden Fossa, MD 02/13/17 2302

## 2017-02-13 NOTE — ED Triage Notes (Signed)
Patient arrives with complaint of left scapular pain. States that the pain she currently feels is very similar to when she "had a collapsed lung". Endorses feeling very short of breath when ambulating. Breath sounds diminished bilaterally in upper lobes. Denies fever, cough, injury. States previous pneumothorax was spontaneous.

## 2017-02-14 ENCOUNTER — Other Ambulatory Visit: Payer: Self-pay | Admitting: Cardiothoracic Surgery

## 2017-02-14 ENCOUNTER — Institutional Professional Consult (permissible substitution) (INDEPENDENT_AMBULATORY_CARE_PROVIDER_SITE_OTHER): Payer: Managed Care, Other (non HMO) | Admitting: Cardiothoracic Surgery

## 2017-02-14 ENCOUNTER — Ambulatory Visit
Admission: RE | Admit: 2017-02-14 | Discharge: 2017-02-14 | Disposition: A | Discharge status: Home / Self Care | Payer: Managed Care, Other (non HMO) | Source: Ambulatory Visit | Attending: Cardiothoracic Surgery | Admitting: Cardiothoracic Surgery

## 2017-02-14 VITALS — BP 118/76 | HR 90 | Resp 20 | Ht 70.0 in | Wt 164.0 lb

## 2017-02-14 DIAGNOSIS — J9383 Other pneumothorax: Secondary | ICD-10-CM

## 2017-02-14 NOTE — Patient Instructions (Signed)
Steps to Quit Smoking Smoking tobacco can be harmful to your health and can affect almost every organ in your body. Smoking puts you, and those around you, at risk for developing many serious chronic diseases. Quitting smoking is difficult, but it is one of the best things that you can do for your health. It is never too late to quit. What are the benefits of quitting smoking? When you quit smoking, you lower your risk of developing serious diseases and conditions, such as:  Lung cancer or lung disease, such as COPD.  Heart disease.  Stroke.  Heart attack.  Infertility.  Osteoporosis and bone fractures. Additionally, symptoms such as coughing, wheezing, and shortness of breath may get better when you quit. You may also find that you get sick less often because your body is stronger at fighting off colds and infections. If you are pregnant, quitting smoking can help to reduce your chances of having a baby of low birth weight. How do I get ready to quit? When you decide to quit smoking, create a plan to make sure that you are successful. Before you quit:  Pick a date to quit. Set a date within the next two weeks to give you time to prepare.  Write down the reasons why you are quitting. Keep this list in places where you will see it often, such as on your bathroom mirror or in your car or wallet.  Identify the people, places, things, and activities that make you want to smoke (triggers) and avoid them. Make sure to take these actions:  Throw away all cigarettes at home, at work, and in your car.  Throw away smoking accessories, such as Scientist, research (medical).  Clean your car and make sure to empty the ashtray.  Clean your home, including curtains and carpets.  Tell your family, friends, and coworkers that you are quitting. Support from your loved ones can make quitting easier.  Talk with your health care provider about your options for quitting smoking.  Find out what treatment  options are covered by your health insurance. What strategies can I use to quit smoking? Talk with your healthcare provider about different strategies to quit smoking. Some strategies include:  Quitting smoking altogether instead of gradually lessening how much you smoke over a period of time. Research shows that quitting "cold [place]" is more successful than gradually quitting.  Attending in-person counseling to help you build problem-solving skills. You are more likely to have success in quitting if you attend several counseling sessions. Even short sessions of 10 minutes can be effective.  Finding resources and support systems that can help you to quit smoking and remain smoke-free after you quit. These resources are most helpful when you use them often. They can include:  Online chats with a Social worker.  Telephone quitlines.  Printed Furniture conservator/restorer.  Support groups or group counseling.  Text messaging programs.  Mobile phone applications.  Taking medicines to help you quit smoking. (If you are pregnant or breastfeeding, talk with your health care provider first.) Some medicines contain nicotine and some do not. Both types of medicines help with cravings, but the medicines that include nicotine help to relieve withdrawal symptoms. Your health care provider may recommend:  Nicotine patches, gum, or lozenges.  Nicotine inhalers or sprays.  Non-nicotine medicine that is taken by mouth. Talk with your health care provider about combining strategies, such as taking medicines while you are also receiving in-person counseling. Using these two strategies together makes you more  likely to succeed in quitting than if you used either strategy on its own. If you are pregnant or breastfeeding, talk with your health care provider about finding counseling or other support strategies to quit smoking. Do not take medicine to help you quit smoking unless told to do so by your health care  provider. What things can I do to make it easier to quit? Quitting smoking might feel overwhelming at first, but there is a lot that you can do to make it easier. Take these important actions:  Reach out to your family and friends and ask that they support and encourage you during this time. Call telephone quitlines, reach out to support groups, or work with a counselor for support.  Ask people who smoke to avoid smoking around you.  Avoid places that trigger you to smoke, such as bars, parties, or smoke-break areas at work.  Spend time around people who do not smoke.  Lessen stress in your life, because stress can be a smoking trigger for some people. To lessen stress, try:  Exercising regularly.  Deep-breathing exercises.  Yoga.  Meditating.  Performing a body scan. This involves closing your eyes, scanning your body from head to toe, and noticing which parts of your body are particularly tense. Purposefully relax the muscles in those areas.  Download or purchase mobile phone or tablet apps (applications) that can help you stick to your quit plan by providing reminders, tips, and encouragement. There are many free apps, such as QuitGuide from the [employer] developer for Disease Control and Prevention). You can find other support for quitting smoking (smoking cessation) through smokefree.gov and other websites. How will I feel when I quit smoking? Within the first 24 hours of quitting smoking, you may start to feel some withdrawal symptoms. These symptoms are usually most noticeable 2-3 days after quitting, but they usually do not last beyond 2-3 weeks. Changes or symptoms that you might experience include:  Mood swings.  Restlessness, anxiety, or irritation.  Difficulty concentrating.  Dizziness.  Strong cravings for sugary foods in addition to nicotine.  Mild weight gain.  Constipation.  Nausea.  Coughing or a sore throat.  Changes in how your medicines work in your  body.  A depressed mood.  Difficulty sleeping (insomnia). After the first 2-3 weeks of quitting, you may start to notice more positive results, such as:  Improved sense of smell and taste.  Decreased coughing and sore throat.  Slower heart rate.  Lower blood pressure.  Clearer skin.  The ability to breathe more easily.  Fewer sick days. Quitting smoking is very challenging for most people. Do not get discouraged if you are not successful the first time. Some people need to make many attempts to quit before they achieve long-term success. Do your best to stick to your quit plan, and talk with your health care provider if you have any questions or concerns. This information is not intended to replace advice given to you by your health care provider. Make sure you discuss any questions you have with your health care provider. Document Released: 10/04/2001 Document Revised: 06/07/2016 Document Reviewed: 02/24/2015 Elsevier Interactive Patient Education  2017 Elsevier Inc.  Health Risks of Smoking Smoking cigarettes is very bad for your health. Tobacco smoke has over 200 known poisons in it. It contains the poisonous gases nitrogen oxide and carbon monoxide. There are over 60 chemicals in tobacco smoke that cause cancer. Smoking is difficult to quit because a chemical in tobacco, called nicotine, causes  addiction or dependence. When you smoke and inhale, nicotine is absorbed rapidly into the bloodstream through your lungs. Both inhaled and non-inhaled nicotine may be addictive. What are the risks of cigarette smoke? Cigarette smokers have an increased risk of many serious medical problems, including:  Lung cancer.  Lung disease, such as pneumonia, bronchitis, and emphysema.  Chest pain (angina) and heart attack because the heart is not getting enough oxygen.  Heart disease and peripheral blood vessel disease.  High blood pressure (hypertension).  Stroke.  Oral cancer, including  cancer of the lip, mouth, or voice box.  Bladder cancer.  Pancreatic cancer.  Cervical cancer.  Pregnancy complications, including premature birth.  Stillbirths and smaller newborn babies, birth defects, and genetic damage to sperm.  Early menopause.  Lower estrogen level for women.  Infertility.  Facial wrinkles.  Blindness.  Increased risk of broken bones (fractures).  Senile dementia.  Stomach ulcers and internal bleeding.  Delayed wound healing and increased risk of complications during surgery.  Even smoking lightly shortens your life expectancy by several years. Because of secondhand smoke exposure, children of smokers have an increased risk of the following:  Sudden infant death syndrome (SIDS).  Respiratory infections.  Lung cancer.  Heart disease.  Ear infections. What are the benefits of quitting? There are many health benefits of quitting smoking. Here are some of them:  Within days of quitting smoking, your risk of having a heart attack decreases, your blood flow improves, and your lung capacity improves. Blood pressure, pulse rate, and breathing patterns start returning to normal soon after quitting.  Within months, your lungs may clear up completely.  Quitting for 10 years reduces your risk of developing lung cancer and heart disease to almost that of a nonsmoker.  People who quit may see an improvement in their overall quality of life. How do I quit smoking? Smoking is an addiction with both physical and psychological effects, and longtime habits can be hard to change. Your health care provider can recommend:  Programs and community resources, which may include group support, education, or talk therapy.  Prescription medicines to help reduce cravings.  Nicotine replacement products, such as patches, gum, and nasal sprays. Use these products only as directed. Do not replace cigarette smoking with electronic cigarettes, which are commonly called  e-cigarettes. The safety of e-cigarettes is not known, and some may contain harmful chemicals.  A combination of two or more of these methods. Where to find more information:  American Lung Association: www.lung.org  American Cancer Society: www.cancer.org Summary  Smoking cigarettes is very bad for your health. Cigarette smokers have an increased risk of many serious medical problems, including several cancers, heart disease, and stroke.  Smoking is an addiction with both physical and psychological effects, and longtime habits can be hard to change.  By stopping right away, you can greatly reduce the risk of medical problems for you and your family.  To help you quit smoking, your health care provider can recommend programs, community resources, prescription medicines, and nicotine replacement products such as patches, gum, and nasal sprays. This information is not intended to replace advice given to you by your health care provider. Make sure you discuss any questions you have with your health care provider. Document Released: 11/17/2004 Document Revised: 10/14/2016 Document Reviewed: 10/14/2016 Elsevier Interactive Patient Education  2017 Elsevier Inc.   Pneumothorax A pneumothorax, commonly called a collapsed lung, is a condition in which air leaks from a lung and builds up in the space  between the lung and the chest wall (pleural space). The air in a pneumothorax is trapped outside the lung and takes up space, preventing the lung from fully expanding. This is a condition that usually occurs suddenly. The buildup of air may be small or large. A small pneumothorax may go away on its own. When a pneumothorax is larger, it will often require medical treatment and hospitalization. What are the causes? A pneumothorax can sometimes happen quickly with no apparent cause. People with underlying lung problems, particularly COPD or emphysema, are at higher risk of pneumothorax. However,  pneumothorax can happen quickly even in people with no prior known lung problems. Trauma, surgery, medical procedures, or injury to the chest wall can also cause a pneumothorax. What are the signs or symptoms? Sometimes a pneumothorax will have no symptoms. When symptoms are present, they can include:  Chest pain.  Shortness of breath.  Increased rate of breathing.  Bluish color to your lips or skin (cyanosis). How is this diagnosed? Pneumothorax is usually diagnosed by a chest X-ray or chest CT scan. Your health care provider will also take a medical history and perform a physical exam to determine why you may have a pneumothorax. How is this treated? A small pneumothorax may go away on its own without treatment. Extra oxygen can sometimes help a small pneumothorax go away more quickly. For a larger pneumothorax or a pneumothorax that is causing symptoms, a procedure is usually needed to drain the air.In some cases, the health care provider may drain the air using a needle. In other cases, a chest tube may be inserted into the pleural space. A chest tube is a small tube placed between the ribs and into the pleural space. This removes the extra air and allows the lung to expand back to its normal size. A large pneumothorax will usually require a hospital stay. If there is ongoing air leakage into the pleural space, then the chest tube may need to remain in place for several days until the air leak has healed. In some cases, surgery may be needed. Follow these instructions at home:  Only take over-the-counter or prescription medicines as directed by your health care provider.  If a cough or pain makes it difficult for you to sleep at night, try sleeping in a semi-upright position in a recliner or by using 2 or 3 pillows.  Rest and limit activity as directed by your health care provider.  If you had a chest tube and it was removed, ask your health care provider when it is okay to remove the  dressing. Until your health care provider says you can remove the dressing, do not allow it to get wet.  Do not smoke. Smoking is a risk factor for pneumothorax.  Do not fly in an airplane or scuba dive until your health care provider says it is okay.  Follow up with your health care provider as directed. Get help right away if:  You have increasing chest pain or shortness of breath.  You have a cough that is not controlled with suppressants.  You begin coughing up blood.  You have pain that is getting worse or is not controlled with medicines.  You cough up thick, discolored mucus (sputum) that is yellow to green in color.  You have redness, increasing pain, or discharge at the site where a chest tube had been in place (if your pneumothorax was treated with a chest tube).  The site where your chest tube was located  opens up.  You feel air coming out of the site where the chest tube was placed.  You have a fever or persistent symptoms for more than 2-3 days.  You have a fever and your symptoms suddenly get worse. This information is not intended to replace advice given to you by your health care provider. Make sure you discuss any questions you have with your health care provider. Document Released: 10/10/2005 Document Revised: 03/17/2016 Document Reviewed: 03/05/2014 Elsevier Interactive Patient Education  2017 [employer].

## 2017-02-14 NOTE — Progress Notes (Signed)
[address]       [place] [address]             [phone]                    Brithney Bensen [facility] Medical Record #[id number] Date of Birth: 11/26/1990  Referring: Tilden Fossa, MD Primary Care: Emeterio Reeve, MD  Chief Complaint:    Chief Complaint  Patient presents with  . Spontaneous Pneumothorax    f/u from ED visit with CXR    History of Present Illness:    Martha Navarro 26 y.o. female is seen in the office  today for follow up from ER. Presented to ER last night with complaining of back pain, sob. She had similar symptoms 5 years ago and had a spontaneous pneumothorax. It resolved  at that time , was not seen by thoracic surgery at that time , no  surgical procedure or chest tube done.    She is 5 months postpartum and is using Depo-Provera for contraceptive. She is not currently breast-feeding  She comes into office today feeling better then yesterday, no sob  Current Activity/ Functional Status:  Patient is independent with mobility/ambulation, transfers, ADL's, IADL's.   Zubrod Score: At the time of surgery this patient's most appropriate activity status/level should be described as:     0    Normal activity, no symptoms     1    Restricted in physical strenuous activity but ambulatory, able to do out light work     2    Ambulatory and capable of self care, unable to do work activities, up and about               >50 % of waking hours                                  3    Only limited self care, in bed greater than 50% of waking hours     4    Completely disabled, no self care, confined to bed or chair     5    Moribund   Past Medical History:  Diagnosis Date  . Medical history non-contributory   . Pneumothorax     Past Surgical History:  Procedure Laterality Date  . NO PAST SURGERIES      Family History  Problem Relation Age of Onset  . Liver cancer Maternal Grandmother     Social History   Social  History  . Marital status: Single    Spouse name: N/A  . Number of children: N/A  . Years of education: N/A   Occupational History  . Not on file.   Social History Main Topics  . Smoking status: Former Smoker    Quit date: 09/01/2011  . Smokeless tobacco: Never Used  . Alcohol use No  . Drug use: No  . Sexual activity: Not on file      History  Smoking Status  . Former Smoker  . Quit date: 09/01/2011  Smokeless Tobacco  . Never Used    History  Alcohol Use No     No Known Allergies  Current Outpatient Prescriptions  Medication Sig Dispense Refill  . HYDROcodone-acetaminophen (NORCO) 5-325 MG tablet Take 1 tablet by mouth every 6 (six) hours as needed for moderate pain. 8 tablet 0  . levothyroxine (SYNTHROID, LEVOTHROID) 75 MCG  tablet Take 1 tablet by mouth daily.    . pantoprazole (PROTONIX) 40 MG tablet Take 40 mg by mouth at bedtime as needed (heartburn).     . Prenatal Vit-Fe Fumarate-FA (PRENATAL MULTIVITAMIN) TABS tablet Take 1 tablet by mouth daily at 12 noon.     . valACYclovir (VALTREX) 1000 MG tablet Take 1,000 mg by mouth daily.      No current facility-administered medications for this visit.        Review of Systems:     Cardiac Review of Systems: Y or N  Chest Pain [ y   ]  Resting SOB [  y ] Exertional SOB  [ y ]  Orthopnea [  ]   Pedal Edema [   ]    Palpitations [  ] Syncope  [  ]   Presyncope [   ]  General Review of Systems: [Y] = yes [  ]=no Constitional: recent weight change [  ];  Wt loss over the last 3 months [   ] anorexia [  ]; fatigue [  ]; nausea [  ]; night sweats [  ]; fever [  ]; or chills [  ];          Dental: poor dentition[  ]; Last Dentist visit:   Eye : blurred vision [  ]; diplopia [   ]; vision changes [  ];  Amaurosis fugax[  ]; Resp: cough [  ];  wheezing[  ];  hemoptysis[  ]; shortness of breath[  ]; paroxysmal nocturnal dyspnea[  ]; dyspnea on exertion[  ]; or orthopnea[  ];  GI:  gallstones[  ], vomiting[  ];  dysphagia[   ]; melena[  ];  hematochezia [  ]; heartburn[  ];   Hx of  Colonoscopy[  ]; GU: kidney stones [  ]; hematuria[  ];   dysuria [  ];  nocturia[  ];  history of     obstruction [  ]; urinary frequency [  ]             Skin: rash, swelling[  ];, hair loss[  ];  peripheral edema[  ];  or itching[  ]; Musculosketetal: myalgias[  ];  joint swelling[  ];  joint erythema[  ];  joint pain[  ];  back pain[  ];  Heme/Lymph: bruising[  ];  bleeding[  ];  anemia[  ];  Neuro: TIA[  ];  headaches[  ];  stroke[  ];  vertigo[  ];  seizures[  ];   paresthesias[  ];  difficulty walking[  ];  Psych:depression[  ]; anxiety[  ];  Endocrine: diabetes[  ];  thyroid dysfunction[  ];  Immunizations: Flu up to date [  ]; Pneumococcal up to date [  ];  Other:  Physical Exam: BP 118/76   Pulse 90   Resp 20   Ht  (1.778 m)   Wt 164 lb (74.4 kg)   LMP 02/13/2017   SpO2 98% Comment: RA  BMI 23.53 kg/m   PHYSICAL EXAMINATION: General appearance: alert, cooperative and no distress Head: Normocephalic, without obvious abnormality, atraumatic Neck: no adenopathy, no carotid bruit, no JVD, supple, symmetrical, trachea midline and thyroid not enlarged, symmetric, no tenderness/mass/nodules Lymph nodes: Cervical, supraclavicular, and axillary nodes normal. Resp: clear to auscultation bilaterally Back: no curvature Cardio: regular rate and rhythm, S1, S2 normal, no murmur, click, rub or gallop GI: soft, non-tender; bowel sounds normal; no masses,  no organomegaly Extremities: extremities  normal, atraumatic, no cyanosis or edema and Homans sign is negative, no sign of DVT Neurologic: Grossly normal  Diagnostic Studies & Laboratory data:     Recent Radiology Findings:   Dg Chest 2 View  Result Date: 02/14/2017 CLINICAL DATA:  Re- check left apical pneumothorax. EXAM: CHEST  2 VIEW COMPARISON:  02/13/2017. FINDINGS: Mediastinum and hilar structures are normal. Stable small left apical pneumothorax again noted.  Pneumothorax is decreased in size prior exam. No focal infiltrate. No pleural effusion or pneumothorax. Heart size normal. No acute bony abnormality . IMPRESSION: Persistent but improved small left apical pneumothorax. Electronically Signed   By: Maisie Fus  Register   On: 02/14/2017 11:57   Dg Chest 2 View  Result Date: 02/13/2017 CLINICAL DATA:  Acute onset of shortness of breath and left scapular pain. Initial encounter. EXAM: CHEST  2 VIEW COMPARISON:  Chest radiograph from 10/07/2011 FINDINGS: A small left apical pneumothorax is noted. The lungs are otherwise clear. No pleural effusion is seen. Accessory azygos lobe is noted. The heart is normal in size. No acute osseous abnormalities are seen. IMPRESSION: Small left apical pneumothorax noted, measuring approximately 15% of left lung volume. This would typically resorb within 10 days. These results were called by telephone at the time of interpretation on 02/13/2017 at 9:03 pm to Dr. Chaney Malling, who verbally acknowledged these results. Electronically Signed   By: Roanna Raider M.D.   On: 02/13/2017 21:03     I have independently reviewed the above radiologic studies.  Recent Lab Findings: Lab Results  Component Value Date   WBC 9.1 02/13/2017   HGB 12.4 02/13/2017   HCT 37.6 02/13/2017   PLT 355 02/13/2017   GLUCOSE 98 02/13/2017   NA 135 02/13/2017   K 3.5 02/13/2017   CL 105 02/13/2017   CREATININE 0.55 02/13/2017   BUN 10 02/13/2017   CO2 21 (L) 02/13/2017      Assessment / Plan:   Stable recurrent left ptx, discussed with patient the risk of recurrent PTX, with young baby at home she was not interesed in operative procedure currently. Chest xray is stable, She will stop smoking, follow up chest xray in two weeks. She knows to call for recurrent symptoms.       I  spent 20 minutes counseling the patient face to face and 50% or more the  time was spent in counseling and coordination of care. The total time spent in the appointment  was 30 minutes.  Delight Ovens MD      [address] [place].[address] [place] [address] Office [phone]   Beeper [phone]  02/14/2017 1:28 PM

## 2017-02-27 ENCOUNTER — Other Ambulatory Visit: Payer: Self-pay | Admitting: Cardiothoracic Surgery

## 2017-02-27 DIAGNOSIS — J9383 Other pneumothorax: Secondary | ICD-10-CM

## 2017-03-02 ENCOUNTER — Ambulatory Visit
Admission: RE | Admit: 2017-03-02 | Discharge: 2017-03-02 | Disposition: A | Discharge status: Home / Self Care | Payer: Managed Care, Other (non HMO) | Source: Ambulatory Visit | Attending: Cardiothoracic Surgery | Admitting: Cardiothoracic Surgery

## 2017-03-02 ENCOUNTER — Ambulatory Visit (INDEPENDENT_AMBULATORY_CARE_PROVIDER_SITE_OTHER): Payer: Managed Care, Other (non HMO) | Admitting: Cardiothoracic Surgery

## 2017-03-02 ENCOUNTER — Encounter: Payer: Self-pay | Admitting: Cardiothoracic Surgery

## 2017-03-02 VITALS — BP 95/66 | HR 85 | Resp 16 | Ht 70.0 in

## 2017-03-02 DIAGNOSIS — J9383 Other pneumothorax: Secondary | ICD-10-CM | POA: Diagnosis not present

## 2017-03-02 DIAGNOSIS — J939 Pneumothorax, unspecified: Secondary | ICD-10-CM | POA: Diagnosis not present

## 2017-03-02 NOTE — Progress Notes (Signed)
[address]       [place] [id number]             [phone]                    [facility]arth[facility] GrahamShannon Schweppe [facility] Record #[id number] Date of Birth: [birth year]  Referring: Tilden Fossaees, Elizabeth, MD Primary Care: Mila PalmerWolters, Sharon, MD  Chief Complaint:    Chief Complaint  Patient presents with  . Spontaneous Pneumothorax    2 wk f/u with a CXR    History of Present Illness:    Martha GrahamShannon Navarro 26 y.o. female is seen in the office  today for follow up from ER. Presented to ER 02/14/2017  with complaining of back pain, sob. She had similar symptoms 5 years ago and had a spontaneous pneumothorax. It resolved  at that time , was not seen by thoracic surgery at that time , no  surgical procedure or chest tube done.    She is 5 months postpartum and is using Depo-Provera for contraceptive. She is not currently breast-feeding  [name]  Current Activity/ Functional Status:  Patient is independent with mobility/ambulation, transfers, ADL's, IADL's.   Zubrod Score: At the time of surgery this patient's most appropriate activity status/level should be described as: [x]     0    Normal activity, no symptoms []     1    Restricted in physical strenuous activity but ambulatory, able to do out light work []     2    Ambulatory and capable of self care, unable to do work activities, up and about               >50 % of waking hours                              []     3    Only limited self care, in bed greater than 50% of waking hours []     4    Completely disabled, no self care, confined to bed or chair []     5    Moribund   Past Medical History:  Diagnosis Date  . Medical history non-contributory   . Pneumothorax     Past Surgical History:  Procedure Laterality Date  . NO PAST SURGERIES      Family History  Problem Relation Age of Onset  . Liver cancer Maternal Grandmother     Social History   Social History  .  Marital status: Single    Spouse name: N/A  . Number of children: N/A  . Years of education: N/A   Occupational History  . Not on file.   Social History Main Topics  . Smoking status: Former Smoker    Quit date: 09/01/2011  . Smokeless tobacco: Never Used  . Alcohol use No  . Drug use: No  . Sexual activity: Not on file      History  Smoking Status  . Former Smoker  . Quit date: 09/01/2011  Smokeless Tobacco  . Never Used    History  Alcohol Use No     No Known Allergies  Current Outpatient Prescriptions  Medication Sig Dispense Refill  . HYDROcodone-acetaminophen (NORCO) 5-325 MG tablet Take 1 tablet by mouth every 6 (six) hours as needed for moderate pain. 8 tablet 0  . levothyroxine (SYNTHROID, LEVOTHROID) 75 MCG tablet  Take 1 tablet by mouth daily.    . pantoprazole (PROTONIX) 40 MG tablet Take 40 mg by mouth at bedtime as needed (heartburn).     . Prenatal Vit-Fe Fumarate-FA (PRENATAL MULTIVITAMIN) TABS tablet Take 1 tablet by mouth daily at 12 noon.     . valACYclovir (VALTREX) 1000 MG tablet Take 1,000 mg by mouth daily.      No current facility-administered medications for this visit.        Review of Systems:     Cardiac Review of Systems: Y or N  Chest Pain [ y   ]  Resting SOB [  y ] Exertional SOB  [ y ]  Orthopnea [  ]   Pedal Edema [   ]    Palpitations [  ] Syncope  [  ]   Presyncope [   ]  General Review of Systems: [Y] = yes [  ]=no Constitional: recent weight change [  ];  Wt loss over the last 3 months [   ] anorexia [  ]; fatigue [  ]; nausea [  ]; night sweats [  ]; fever [  ]; or chills [  ];          Dental: poor dentition[  ]; Last Dentist visit:   Eye : blurred vision [  ]; diplopia [   ]; vision changes [  ];  Amaurosis fugax[  ]; Resp: cough [  ];  wheezing[  ];  hemoptysis[  ]; shortness of breath[  ]; paroxysmal nocturnal dyspnea[  ]; dyspnea on exertion[  ]; or orthopnea[  ];  GI:  gallstones[  ], vomiting[  ];  dysphagia[  ]; melena[   ];  hematochezia [  ]; heartburn[  ];   Hx of  Colonoscopy[  ]; GU: kidney stones [  ]; hematuria[  ];   dysuria [  ];  nocturia[  ];  history of     obstruction [  ]; urinary frequency [  ]             Skin: rash, swelling[  ];, hair loss[  ];  peripheral edema[  ];  or itching[  ]; Musculosketetal: myalgias[  ];  joint swelling[  ];  joint erythema[  ];  joint pain[  ];  back pain[  ];  Heme/Lymph: bruising[  ];  bleeding[  ];  anemia[  ];  Neuro: TIA[  ];  headaches[  ];  stroke[  ];  vertigo[  ];  seizures[  ];   paresthesias[  ];  difficulty walking[  ];  Psych:depression[  ]; anxiety[  ];  Endocrine: diabetes[  ];  thyroid dysfunction[  ];  Immunizations: Flu up to date [  ]; Pneumococcal up to date [  ];  Other:  Physical Exam: Ht 5\' 10"  (1.778 m)   LMP 02/13/2017   PHYSICAL EXAMINATION: General appearance: alert, cooperative and no distress Head: Normocephalic, without obvious abnormality, atraumatic Neck: no adenopathy, no carotid bruit, no JVD, supple, symmetrical, trachea midline and thyroid not enlarged, symmetric, no tenderness/mass/nodules Lymph nodes: Cervical, supraclavicular, and axillary nodes normal. Resp: clear to auscultation bilaterally Back: no curvature Cardio: regular rate and rhythm, S1, S2 normal, no murmur, click, rub or gallop GI: soft, non-tender; bowel sounds normal; no masses,  no organomegaly Extremities: extremities normal, atraumatic, no cyanosis or edema and Homans sign is negative, no sign of DVT Neurologic: Grossly normal  Diagnostic Studies & Laboratory data:     Recent Radiology  Findings:   Dg Chest 2 View  Result Date: 03/02/2017 CLINICAL DATA:  Left-sided chest pain. EXAM: CHEST  2 VIEW COMPARISON:  Radiographs of February 14, 2017. FINDINGS: The heart size and mediastinal contours are within normal limits. Right lung is clear. No pleural effusion is noted. Stable small left apical pneumothorax is noted. The visualized skeletal structures are  unremarkable. IMPRESSION: Stable small left apical pneumothorax. Electronically Signed   By: Lupita Raider, M.D.   On: 03/02/2017 14:04   Dg Chest 2 View  Result Date: 02/14/2017 CLINICAL DATA:  Re- check left apical pneumothorax. EXAM: CHEST  2 VIEW COMPARISON:  02/13/2017. FINDINGS: Mediastinum and hilar structures are normal. Stable small left apical pneumothorax again noted. Pneumothorax is decreased in size prior exam. No focal infiltrate. No pleural effusion or pneumothorax. Heart size normal. No acute bony abnormality . IMPRESSION: Persistent but improved small left apical pneumothorax. Electronically Signed   By: Maisie Fus  Register   On: 02/14/2017 11:57   Dg Chest 2 View  Result Date: 02/13/2017 CLINICAL DATA:  Acute onset of shortness of breath and left scapular pain. Initial encounter. EXAM: CHEST  2 VIEW COMPARISON:  Chest radiograph from 10/07/2011 FINDINGS: A small left apical pneumothorax is noted. The lungs are otherwise clear. No pleural effusion is seen. Accessory azygos lobe is noted. The heart is normal in size. No acute osseous abnormalities are seen. IMPRESSION: Small left apical pneumothorax noted, measuring approximately 15% of left lung volume. This would typically resorb within 10 days. These results were called by telephone at the time of interpretation on 02/13/2017 at 9:03 pm to Dr. Chaney Malling, who verbally acknowledged these results. Electronically Signed   By: Roanna Raider M.D.   On: 02/13/2017 21:03     I have independently reviewed the above radiologic studies.  Recent Lab Findings: Lab Results  Component Value Date   WBC 9.1 02/13/2017   HGB 12.4 02/13/2017   HCT 37.6 02/13/2017   PLT 355 02/13/2017   GLUCOSE 98 02/13/2017   NA 135 02/13/2017   K 3.5 02/13/2017   CL 105 02/13/2017   CREATININE 0.55 02/13/2017   BUN 10 02/13/2017   CO2 21 (L) 02/13/2017      Assessment / Plan:   Stable recurrent left ptx, discussed with patient the risk of recurrent  PTX, with young baby at home she was not interesed in operative procedure currently. Chest xray has improved , She will stop smoking, follow up chest xray in 4 weeks. She knows to call for recurrent symptoms.     Delight Ovens MD      [address] [place] [place] [address] Office [phone]   Beeper [phone]  03/02/2017 2:16 PM

## 2017-04-05 ENCOUNTER — Other Ambulatory Visit: Payer: Self-pay | Admitting: Cardiothoracic Surgery

## 2017-04-05 DIAGNOSIS — J9383 Other pneumothorax: Secondary | ICD-10-CM

## 2017-04-06 ENCOUNTER — Ambulatory Visit: Payer: Managed Care, Other (non HMO) | Admitting: Cardiothoracic Surgery

## 2017-04-06 NOTE — Progress Notes (Deleted)
[address]       [place] [address]             [phone]                    Su Duma [facility] Medical Record #[id number] Date of Birth: 1991-06-19  Referring: Tilden Fossa, MD Primary Care: Mila Palmer, MD  Chief Complaint:    No chief complaint on file.   History of Present Illness:    Martha Navarro 26 y.o. female is seen in the office  today for follow up from ER. Presented to ER 02/14/2017  with complaining of back pain, sob. She had similar symptoms 5 years ago and had a spontaneous pneumothorax. It resolved  at that time , was not seen by thoracic surgery at that time , no  surgical procedure or chest tube done.    She is 5 months postpartum and is using Depo-Provera for contraceptive. She is not currently breast-feeding  [name]  Current Activity/ Functional Status:  Patient is independent with mobility/ambulation, transfers, ADL's, IADL's.   Zubrod Score: At the time of surgery this patient's most appropriate activity status/level should be described as: [x]     0    Normal activity, no symptoms []     1    Restricted in physical strenuous activity but ambulatory, able to do out light work []     2    Ambulatory and capable of self care, unable to do work activities, up and about               >50 % of waking hours                              []     3    Only limited self care, in bed greater than 50% of waking hours []     4    Completely disabled, no self care, confined to bed or chair []     5    Moribund   Past Medical History:  Diagnosis Date  . Medical history non-contributory   . Pneumothorax     Past Surgical History:  Procedure Laterality Date  . NO PAST SURGERIES      Family History  Problem Relation Age of Onset  . Liver cancer Maternal Grandmother     Social History   Social History  . Marital status: Single    Spouse name: N/A  . Number of children: N/A   . Years of education: N/A   Occupational History  . Not on file.   Social History Main Topics  . Smoking status: Former Smoker    Quit date: 09/01/2011  . Smokeless tobacco: Never Used  . Alcohol use No  . Drug use: No  . Sexual activity: Not on file      History  Smoking Status  . Former Smoker  . Quit date: 09/01/2011  Smokeless Tobacco  . Never Used    History  Alcohol Use No     No Known Allergies  No current outpatient prescriptions on file.   No current facility-administered medications for this visit.        Review of Systems:     Cardiac Review of Systems: Y or N  Chest Pain [ y   ]  Resting SOB [  y ] Exertional SOB  [  y ]  [name] [  ]   Pedal Edema [   ]    Palpitations [  ] Syncope  [  ]   Presyncope [   ]  General Review of Systems: [Y] = yes [  ]=no Constitional: recent weight change [  ];  Wt loss over the last 3 months [   ] anorexia [  ]; fatigue [  ]; nausea [  ]; night sweats [  ]; fever [  ]; or chills [  ];          Dental: poor dentition[  ]; Last Dentist visit:   Eye : blurred vision [  ]; diplopia [   ]; vision changes [  ];  Amaurosis fugax[  ]; Resp: cough [  ];  wheezing[  ];  hemoptysis[  ]; shortness of breath[  ]; paroxysmal nocturnal dyspnea[  ]; dyspnea on exertion[  ]; or orthopnea[  ];  GI:  gallstones[  ], vomiting[  ];  dysphagia[  ]; melena[  ];  hematochezia [  ]; heartburn[  ];   Hx of  Colonoscopy[  ]; GU: kidney stones [  ]; hematuria[  ];   dysuria [  ];  nocturia[  ];  history of     obstruction [  ]; urinary frequency [  ]             Skin: rash, swelling[  ];, hair loss[  ];  peripheral edema[  ];  or itching[  ]; Musculosketetal: myalgias[  ];  joint swelling[  ];  joint erythema[  ];  joint pain[  ];  back pain[  ];  Heme/Lymph: bruising[  ];  bleeding[  ];  anemia[  ];  Neuro: TIA[  ];  headaches[  ];  stroke[  ];  vertigo[  ];  seizures[  ];   paresthesias[  ];  difficulty walking[  ];  Psych:depression[  ];  anxiety[  ];  Endocrine: diabetes[  ];  thyroid dysfunction[  ];  Immunizations: Flu up to date [  ]; Pneumococcal up to date [  ];  Other:  Physical Exam: There were no vitals taken for this visit.  PHYSICAL EXAMINATION: General appearance:  Head: Lymph nodes: Resp:  Back:  Cardio:  GI: soft,  Extremities:  Neurologic:   Diagnostic Studies & Laboratory data:     Recent Radiology Findings:   No results found.   I have independently reviewed the above radiologic studies.  Recent Lab Findings: Lab Results  Component Value Date   WBC 9.1 02/13/2017   HGB 12.4 02/13/2017   HCT 37.6 02/13/2017   PLT 355 02/13/2017   GLUCOSE 98 02/13/2017   NA 135 02/13/2017   K 3.5 02/13/2017   CL 105 02/13/2017   CREATININE 0.55 02/13/2017   BUN 10 02/13/2017   CO2 21 (L) 02/13/2017      Assessment / Plan:       Delight OvensEdward B Mayes Sangiovanni MD      [address] [place] [id number] Office [phone]   Beeper [phone]  04/06/2017 9:15 AM

## 2017-08-11 IMAGING — US US MFM OB DETAIL+14 WK
1 series · 14 of 28 positions shown · non-contrast
Comparison: none

[Series 1: us mfm ob detail+14 wk · 58 acquisitions, 14 frames shown]
[im 3/58]
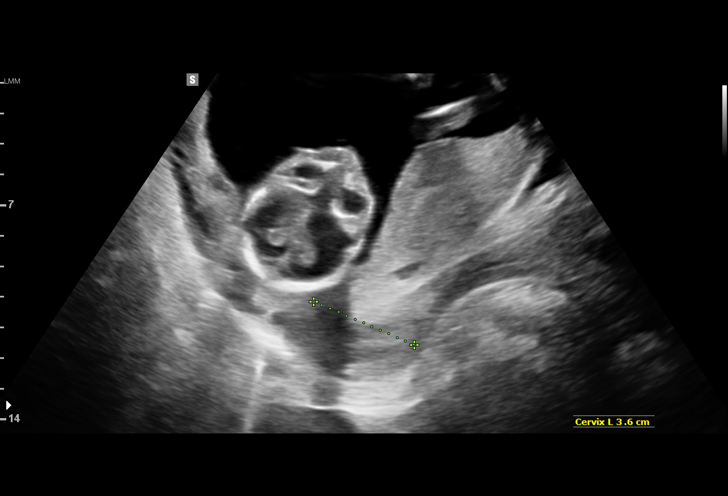
[im 7/58]
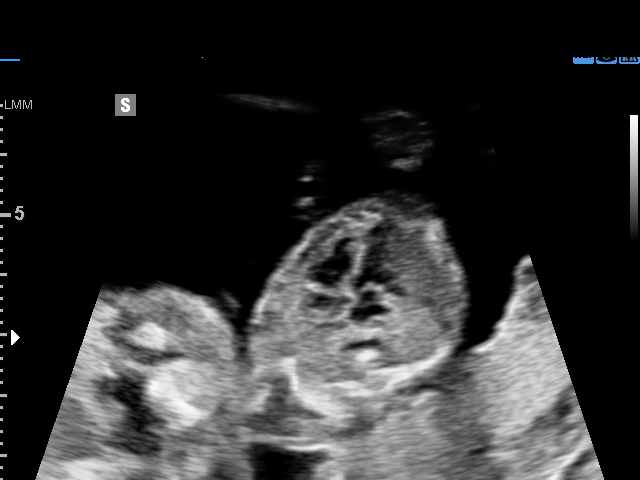
[im 11/58]
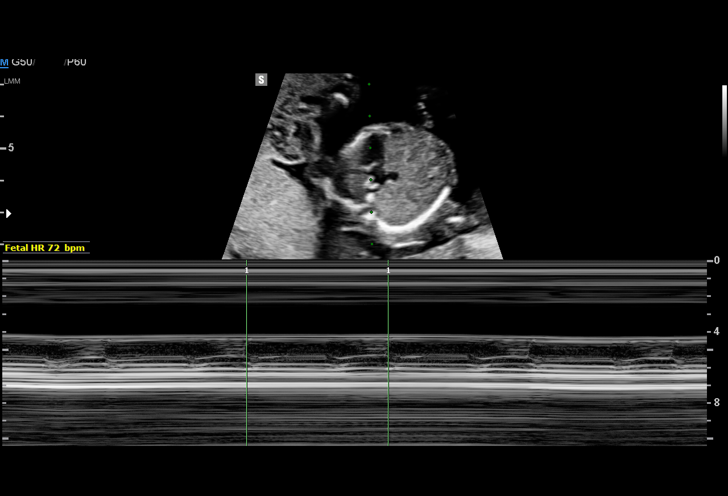
[im 15/58]
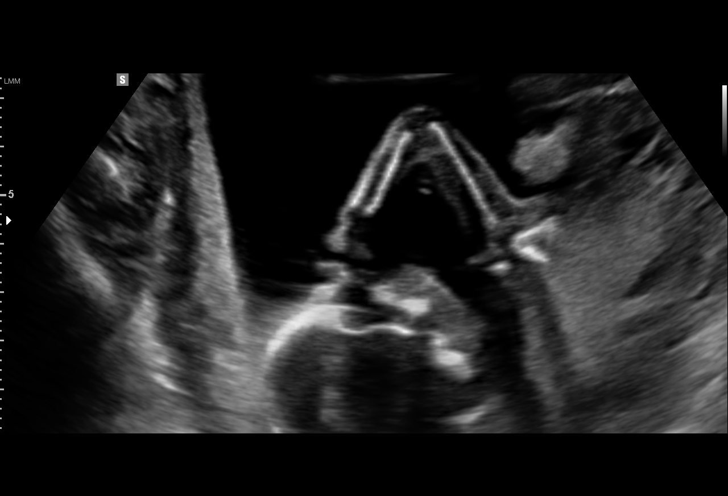
[im 20/58]
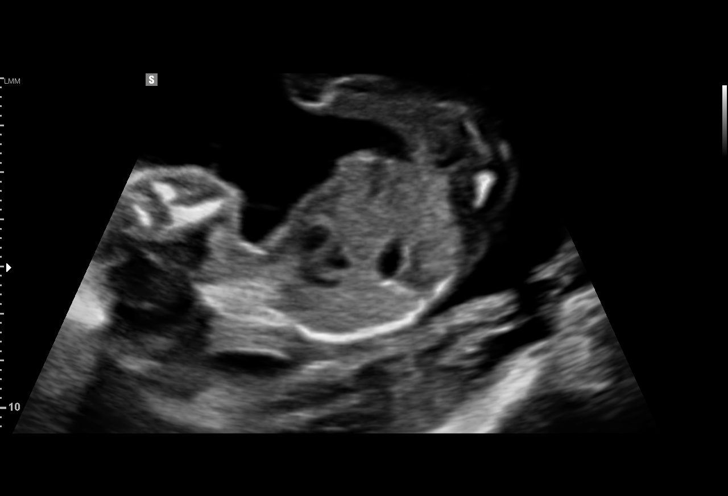
[im 24/58]
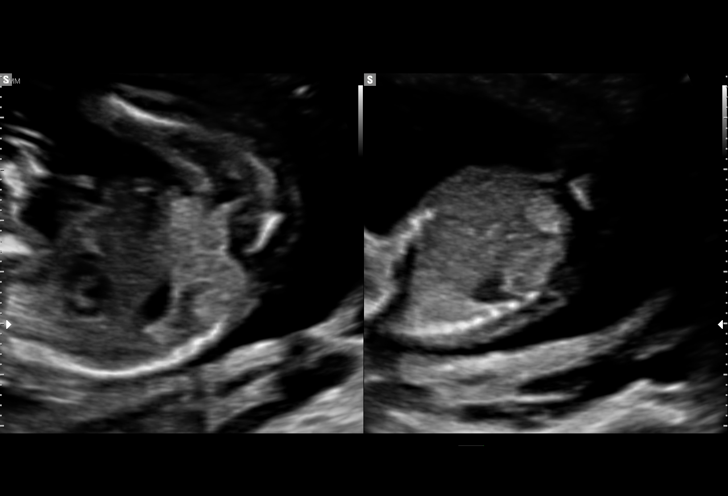
[im 28/58]
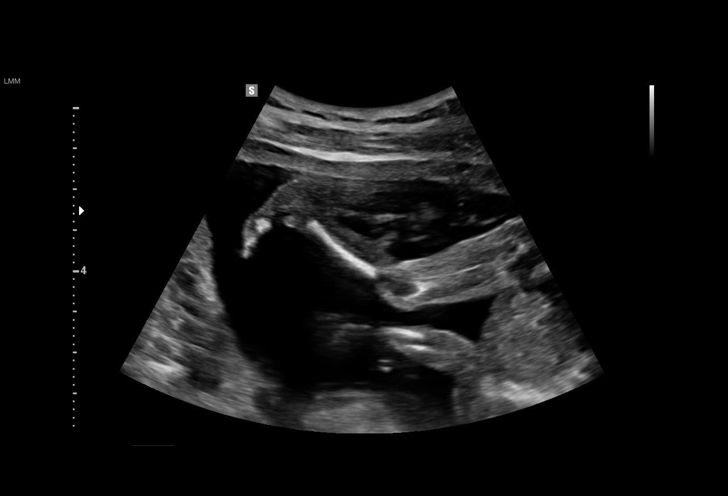
[im 32/58]
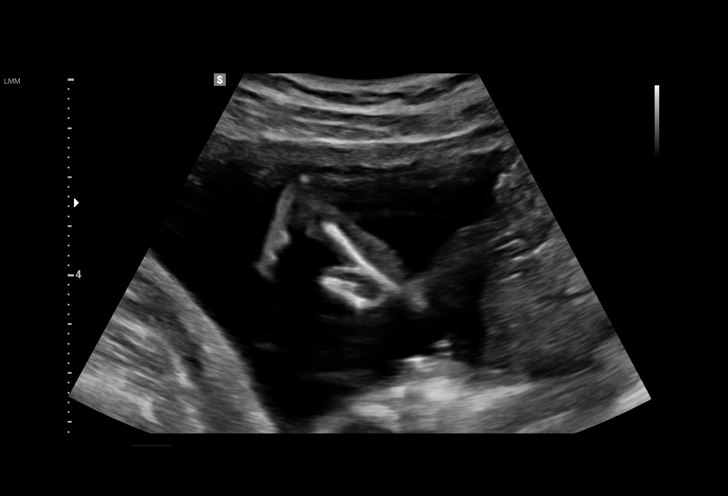
[im 36/58]
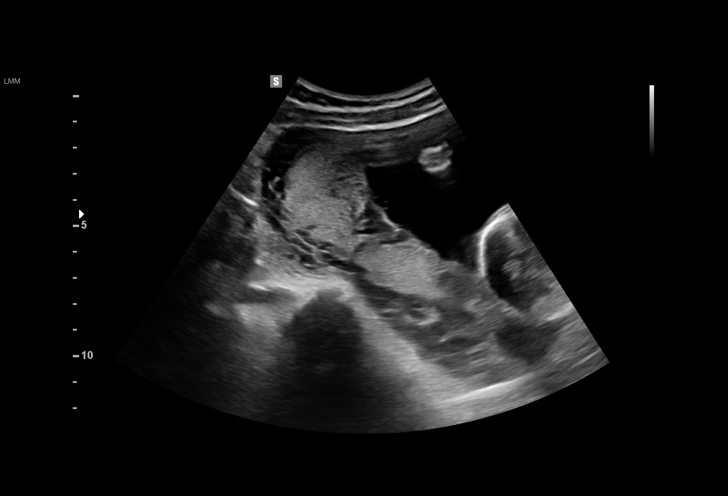
[im 41/58]
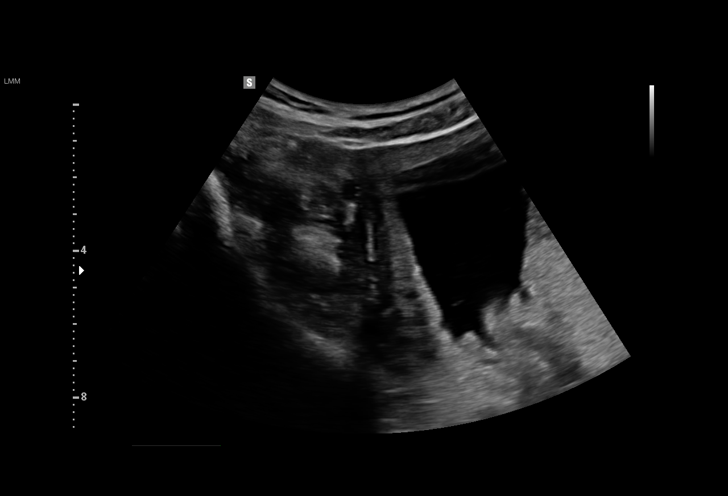
[im 45/58]
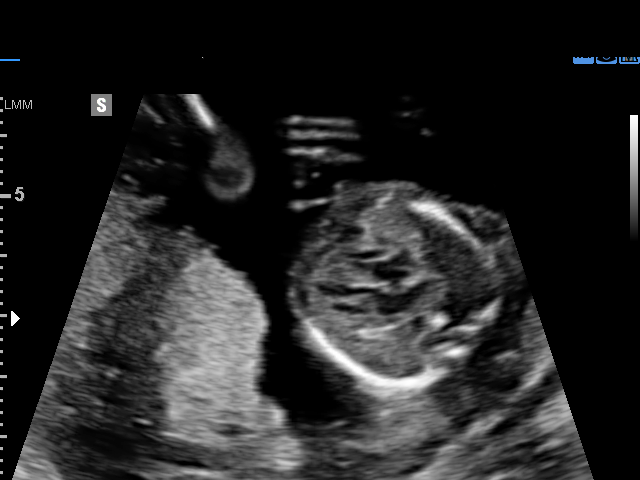
[im 49/58]
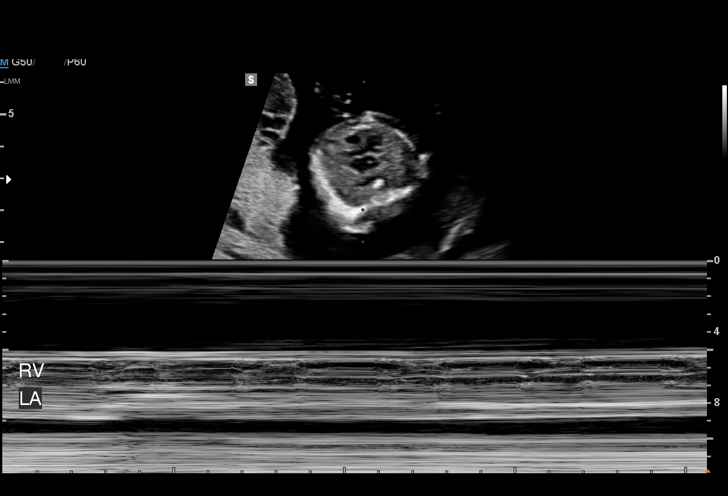
[im 53/58]
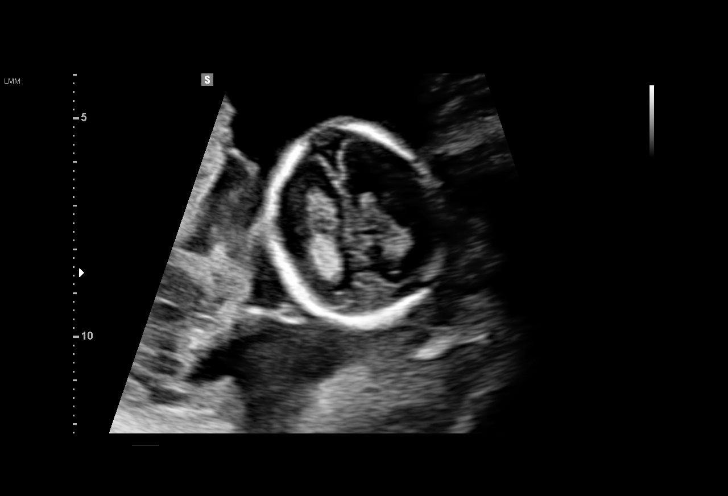
[im 58/58]
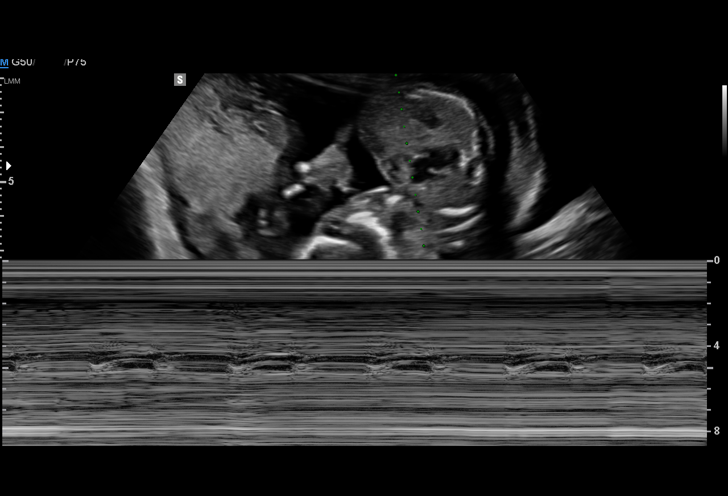

[14 of 28 positions shown; findings below may reference images not displayed]

Obstetrics &
Gynecology
[address]
[place]

1  SOK IAN IIMORI           [id number]       [id number]     [id number]
Indications

17 weeks gestation of pregnancy
Detailed fetal anatomic survey                 Z36
Fetal arrhythmia affecting pregnancy,          O76
antepartum
OB History

Gravidity:    1
Fetal Evaluation

Num Of Fetuses:     1
Fetal Heart         132
Rate(bpm):
Cardiac Activity:   Observed
Presentation:       Cephalic
Placenta:           Posterior, above cervical os
P. Cord Insertion:  Not well visualized

Amniotic Fluid
AFI FV:      Subjectively within normal limits

Largest Pocket(cm)
5.2
Biometry
BPD:      38.5  mm     G. Age:  17w 5d         70  %    CI:        75.41   %    70 - 86
FL/HC:      16.9   %    14.6 -
HC:      140.6  mm     G. Age:  17w 3d         46  %    HC/AC:      1.12        1.07 -
AC:      125.1  mm     G. Age:  18w 1d         76  %    FL/BPD:     61.8   %
FL:       23.8  mm     G. Age:  17w 1d         41  %    FL/AC:      19.0   %    20 - 24

Est. FW:     204  gm      0 lb 7 oz     60  %
Gestational Age

LMP:           17w 2d        Date:  12/23/15                 EDD:   09/28/16
Clinical EDD:  17w 2d                                        EDD:   09/28/16
U/S Today:     17w 4d                                        EDD:   09/26/16
Best:          17w 2d     Det. By:  Clinical EDD             EDD:   09/28/16
Anatomy

Cranium:               Appears normal         Aortic Arch:            Not well visualized
Cavum:                 Not well visualized    Ductal Arch:            Not well visualized
Ventricles:            Not well visualized    Diaphragm:              Not well visualized
Cerebellum:            Not well visualized    Stomach:                Appears normal, left
sided
Posterior Fossa:       Not well visualized    Abdomen:                Appears normal
Nuchal Fold:           Not well visualized    Abdominal Wall:         Appears nml (cord
insert, abd wall)
Face:                  Orbits appear          Cord Vessels:           Not well visualized
normal
Lips:                  Not well visualized    Kidneys:                Appear normal
Thoracic:              Appears normal         Bladder:                Appears normal
Heart:                 Appears normal         Spine:                  Not well visualized
(4CH, axis, and situs
RVOT:                  Appears normal         Upper Extremities:      Visualized
LVOT:                  Appears normal         Lower Extremities:      Visualized

Other:  Technically difficult due to early gestational age and fetal position.
Cervix Uterus Adnexa

Cervix
Length:            3.6  cm.
Normal appearance by transabdominal scan.

Uterus
No abnormality visualized.

Left Ovary
Not visualized.

Right Ovary
Not visualized.

Cul De Sac:   No free fluid seen.

Adnexa:       No abnormality visualized.
Comments

The heart appears structurally normal but was not completely
visualized due to fetal position.  Assessment with M-mode
shows atrial bigemmeny with [DATE] conduction.  The atrial rate
is approximately 120-140 with a ventricular rate of 60-70.
The fetus is very active and thus appears to be tolerating that
vetricular rate well.  Given that the risk of rapid fetal
deterioration is low and Ms. Zvjezdanam can be managed as an
outpatient.  However, she should have an echo sometime
next week.  She will also need a repeat OB ultrasound in 4
weeks.
Impression

Single living intrauterine pregnancy at 17 weeks 2 days.
Appropriate fetal growth (60%).
Normal amniotic fluid volume.
The fetal anatomic survey is not complete.
No gross fetal anomalies identified.
Suspected atrial bigemmeny with [DATE] conduction.
Recommendations

See comments.

## 2018-06-21 IMAGING — DX DG CHEST 2V
2 series · 2 of 2 positions shown · non-contrast
Comparison: Radiographs February 14, 2017.

CLINICAL DATA: Left-sided chest pain.

EXAM:
CHEST  2 VIEW

[dg chest 2 view (1 of 2)]
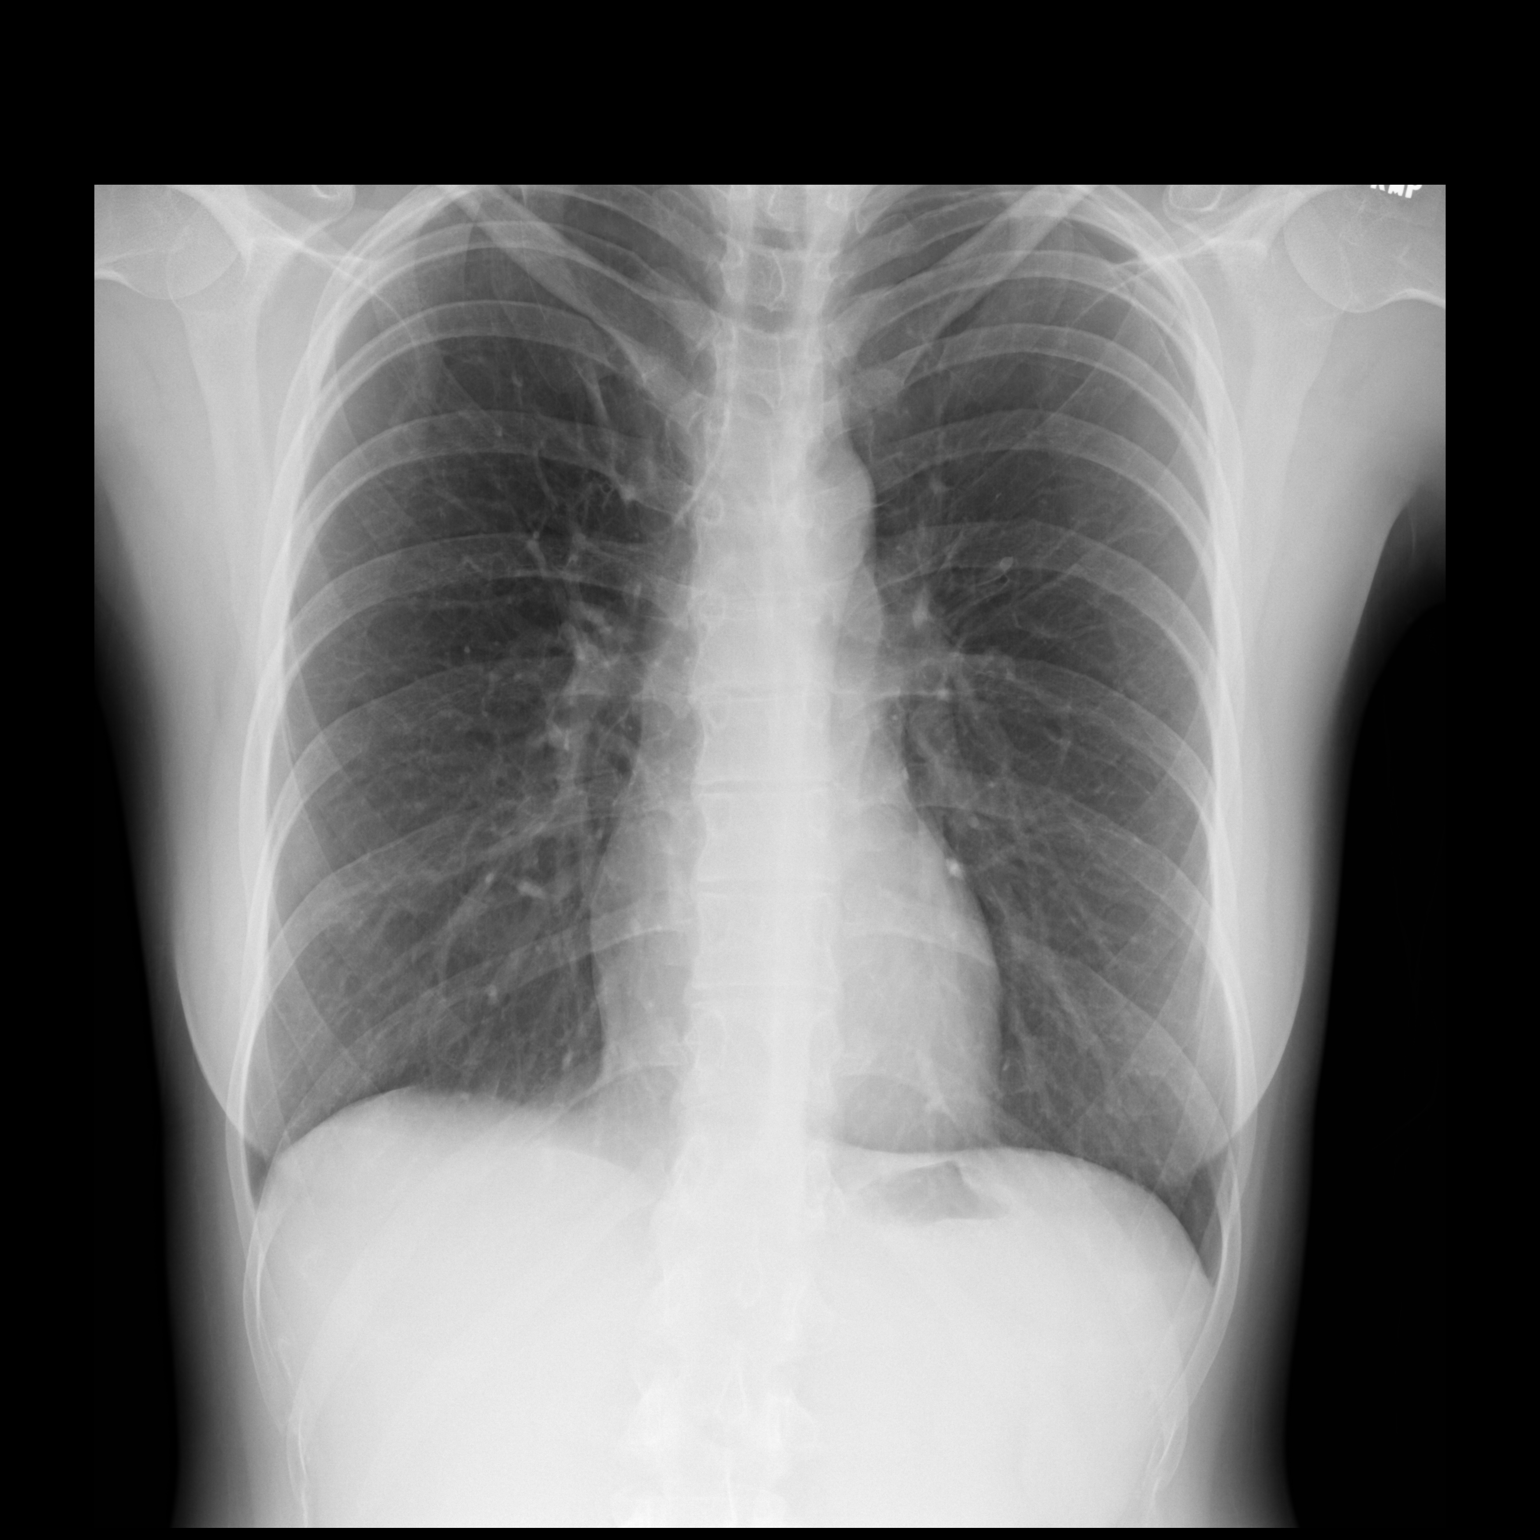

[dg chest 2 view (2 of 2)]
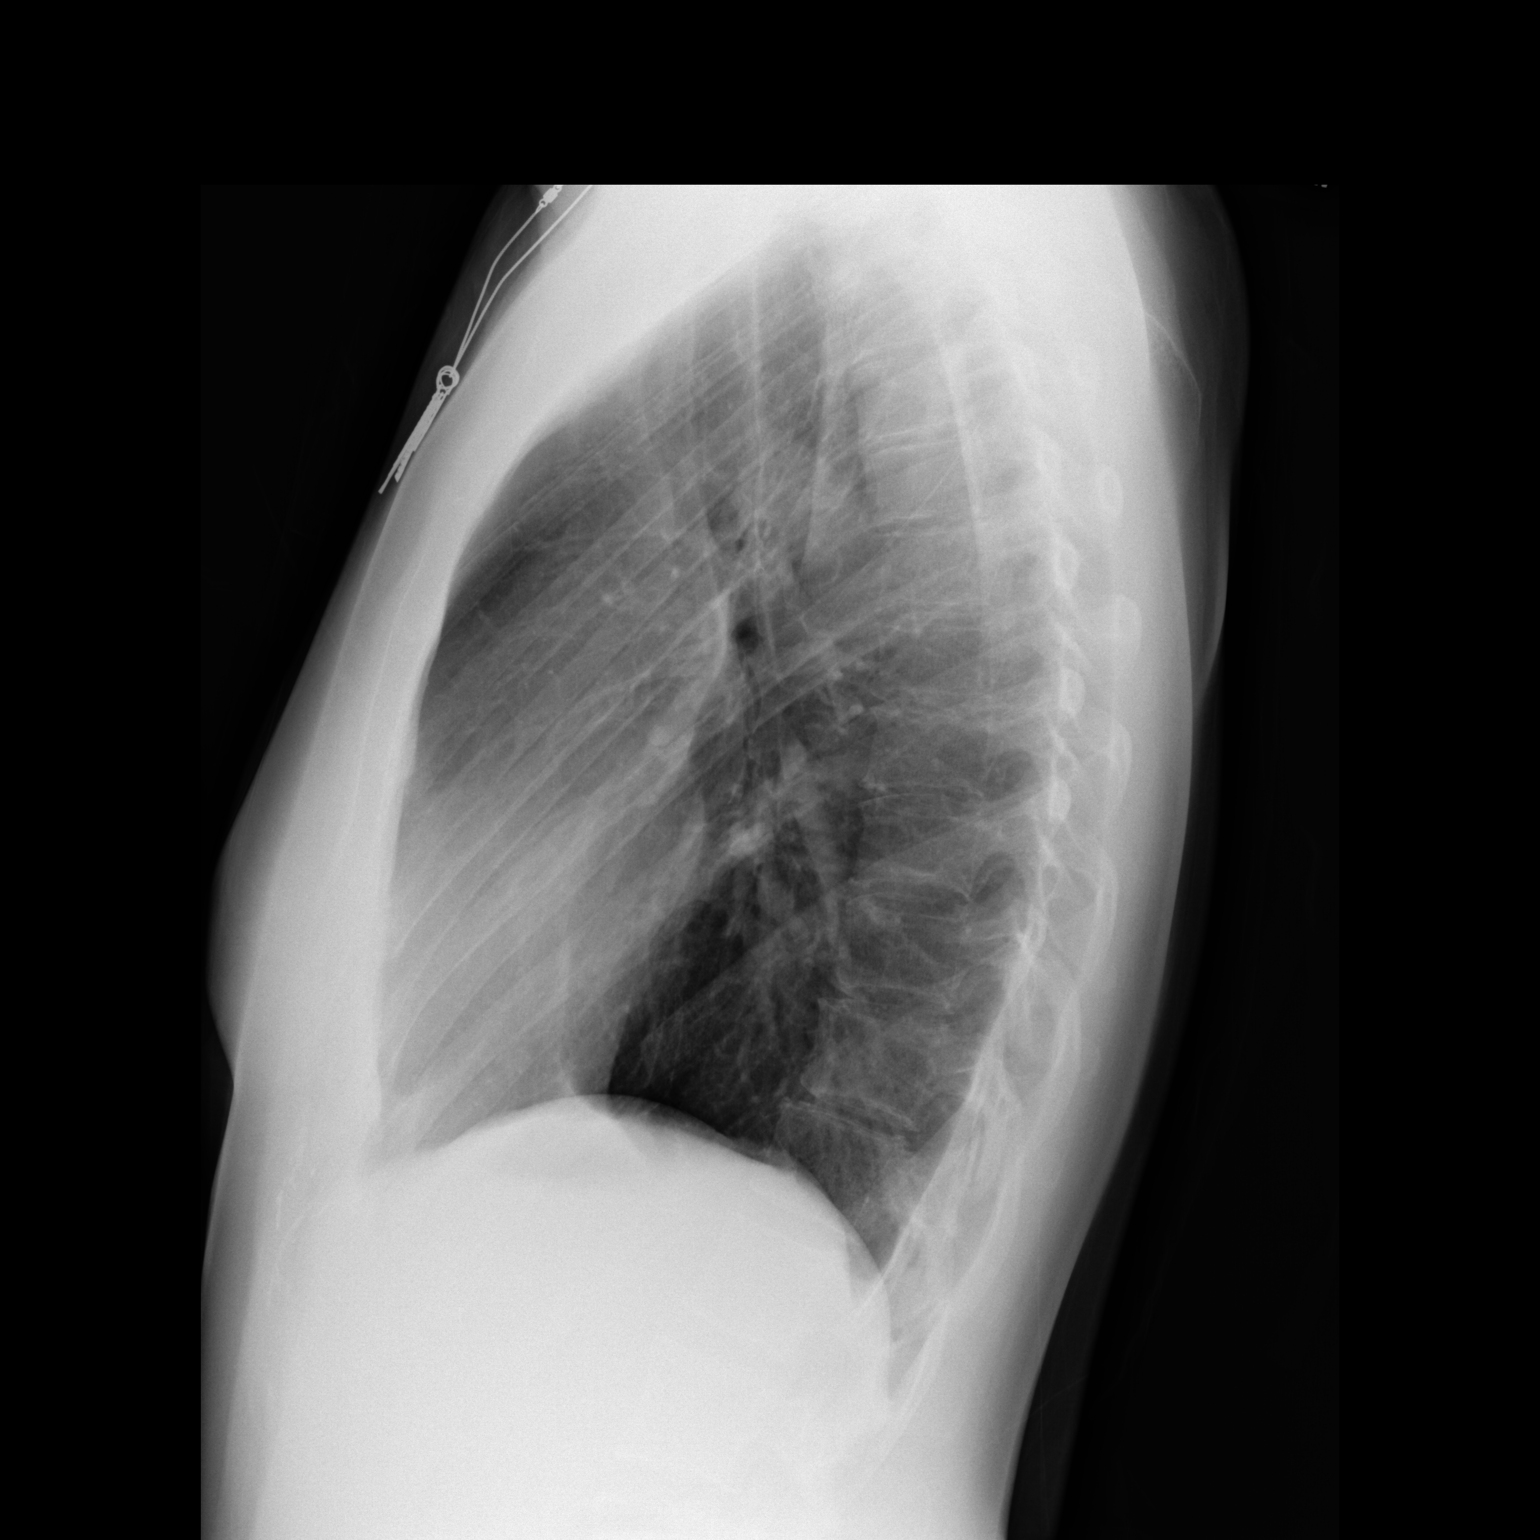

[2 of 2 positions shown; findings below may reference images not displayed]

FINDINGS: The heart size and mediastinal contours are within normal limits.
Right lung is clear. No pleural effusion is noted. Stable small left
apical pneumothorax is noted. The visualized skeletal structures are
unremarkable.
IMPRESSION: Stable small left apical pneumothorax.

## 2019-09-27 ENCOUNTER — Other Ambulatory Visit: Payer: Self-pay

## 2019-09-27 ENCOUNTER — Encounter (HOSPITAL_BASED_OUTPATIENT_CLINIC_OR_DEPARTMENT_OTHER): Payer: Self-pay

## 2019-09-27 ENCOUNTER — Emergency Department (HOSPITAL_BASED_OUTPATIENT_CLINIC_OR_DEPARTMENT_OTHER)
Admission: EM | Admit: 2019-09-27 | Discharge: 2019-09-27 | Disposition: A | Discharge status: Home / Self Care | Payer: Self-pay | Attending: Emergency Medicine | Admitting: Emergency Medicine

## 2019-09-27 DIAGNOSIS — N939 Abnormal uterine and vaginal bleeding, unspecified: Secondary | ICD-10-CM | POA: Insufficient documentation

## 2019-09-27 DIAGNOSIS — Z5321 Procedure and treatment not carried out due to patient leaving prior to being seen by health care provider: Secondary | ICD-10-CM | POA: Insufficient documentation

## 2019-09-27 DIAGNOSIS — R1030 Lower abdominal pain, unspecified: Secondary | ICD-10-CM | POA: Insufficient documentation

## 2019-09-27 LAB — URINALYSIS, ROUTINE W REFLEX MICROSCOPIC
Bilirubin Urine: NEGATIVE
Glucose, UA: NEGATIVE mg/dL
Ketones, ur: NEGATIVE mg/dL
Nitrite: NEGATIVE
Protein, ur: NEGATIVE mg/dL
Specific Gravity, Urine: 1.01 (ref 1.005–1.030)
pH: 7 (ref 5.0–8.0)

## 2019-09-27 LAB — COMPREHENSIVE METABOLIC PANEL
ALT: 19 U/L (ref 0–44)
AST: 21 U/L (ref 15–41)
Albumin: 4.5 g/dL (ref 3.5–5.0)
Alkaline Phosphatase: 62 U/L (ref 38–126)
Anion gap: 9 (ref 5–15)
BUN: 10 mg/dL (ref 6–20)
CO2: 23 mmol/L (ref 22–32)
Calcium: 9.3 mg/dL (ref 8.9–10.3)
Chloride: 104 mmol/L (ref 98–111)
Creatinine, Ser: 0.66 mg/dL (ref 0.44–1.00)
GFR calc Af Amer: >60 mL/min (ref >60)
GFR calc non Af Amer: >60 mL/min (ref >60)
Glucose, Bld: 97 mg/dL (ref 70–99)
Potassium: 3.3 mmol/L — ABNORMAL LOW (ref 3.5–5.1)
Sodium: 136 mmol/L (ref 135–145)
Total Bilirubin: 0.7 mg/dL (ref 0.3–1.2)
Total Protein: 7.6 g/dL (ref 6.5–8.1)

## 2019-09-27 LAB — URINALYSIS, MICROSCOPIC (REFLEX)

## 2019-09-27 LAB — CBC WITH DIFFERENTIAL/PLATELET
Abs Immature Granulocytes: 0.02 10*3/uL (ref 0.00–0.07)
Basophils Absolute: 0 10*3/uL (ref 0.0–0.1)
Basophils Relative: 0 %
Eosinophils Absolute: 0.1 10*3/uL (ref 0.0–0.5)
Eosinophils Relative: 1 %
HCT: 42.6 % (ref 36.0–46.0)
Hemoglobin: 14 g/dL (ref 12.0–15.0)
Immature Granulocytes: 0 %
Lymphocytes Relative: 27 %
Lymphs Abs: 2.3 10*3/uL (ref 0.7–4.0)
MCH: 31.7 pg (ref 26.0–34.0)
MCHC: 32.9 g/dL (ref 30.0–36.0)
MCV: 96.6 fL (ref 80.0–100.0)
Monocytes Absolute: 0.6 10*3/uL (ref 0.1–1.0)
Monocytes Relative: 7 %
Neutro Abs: 5.4 10*3/uL (ref 1.7–7.7)
Neutrophils Relative %: 65 %
Platelets: 273 10*3/uL (ref 150–400)
RBC: 4.41 MIL/uL (ref 3.87–5.11)
RDW: 12.8 % (ref 11.5–15.5)
WBC: 8.5 10*3/uL (ref 4.0–10.5)
nRBC: 0 % (ref 0.0–0.2)

## 2019-09-27 LAB — HCG, QUANTITATIVE, PREGNANCY: hCG, Beta Chain, Quant, S: <1 m[IU]/mL (ref <5)

## 2019-09-27 LAB — PREGNANCY, URINE: Preg Test, Ur: NEGATIVE

## 2019-09-27 NOTE — ED Triage Notes (Signed)
Pt presents to ED with c/o vaginal bleeding X2 days, reports her last menstrual cycle was 08/10/19. Pt brought object with her that she reports passing today. Pt also c/o lower abdominal pain.

## 2022-02-18 LAB — OB RESULTS CONSOLE GC/CHLAMYDIA
Chlamydia: NEGATIVE
Neisseria Gonorrhea: NEGATIVE

## 2022-04-20 LAB — HEPATITIS C ANTIBODY: HCV Ab: NEGATIVE

## 2022-04-20 LAB — OB RESULTS CONSOLE ABO/RH: RH Type: POSITIVE

## 2022-04-20 LAB — OB RESULTS CONSOLE HEPATITIS B SURFACE ANTIGEN: Hepatitis B Surface Ag: NEGATIVE

## 2022-04-20 LAB — OB RESULTS CONSOLE RPR: RPR: NONREACTIVE

## 2022-04-20 LAB — OB RESULTS CONSOLE HIV ANTIBODY (ROUTINE TESTING): HIV: NONREACTIVE

## 2022-04-20 LAB — OB RESULTS CONSOLE RUBELLA ANTIBODY, IGM: Rubella: IMMUNE

## 2022-04-20 LAB — OB RESULTS CONSOLE ANTIBODY SCREEN: Antibody Screen: NEGATIVE

## 2022-09-09 ENCOUNTER — Inpatient Hospital Stay (HOSPITAL_COMMUNITY)
Admission: AD | Admit: 2022-09-09 | Discharge: 2022-09-09 | Disposition: A | Discharge status: Home / Self Care | Payer: Medicaid Other | Attending: Obstetrics & Gynecology | Admitting: Obstetrics & Gynecology

## 2022-09-09 ENCOUNTER — Other Ambulatory Visit: Payer: Self-pay

## 2022-09-09 ENCOUNTER — Encounter (HOSPITAL_COMMUNITY): Payer: Self-pay | Admitting: Obstetrics & Gynecology

## 2022-09-09 DIAGNOSIS — Z3A29 29 weeks gestation of pregnancy: Secondary | ICD-10-CM | POA: Diagnosis not present

## 2022-09-09 DIAGNOSIS — O99891 Other specified diseases and conditions complicating pregnancy: Secondary | ICD-10-CM | POA: Diagnosis not present

## 2022-09-09 DIAGNOSIS — M25559 Pain in unspecified hip: Secondary | ICD-10-CM | POA: Diagnosis not present

## 2022-09-09 DIAGNOSIS — M549 Dorsalgia, unspecified: Secondary | ICD-10-CM

## 2022-09-09 DIAGNOSIS — M62838 Other muscle spasm: Secondary | ICD-10-CM | POA: Diagnosis not present

## 2022-09-09 DIAGNOSIS — O26893 Other specified pregnancy related conditions, third trimester: Secondary | ICD-10-CM | POA: Diagnosis present

## 2022-09-09 LAB — URINALYSIS, ROUTINE W REFLEX MICROSCOPIC
Bilirubin Urine: NEGATIVE
Glucose, UA: NEGATIVE mg/dL
Hgb urine dipstick: NEGATIVE
Ketones, ur: NEGATIVE mg/dL
Leukocytes,Ua: NEGATIVE
Nitrite: NEGATIVE
Protein, ur: NEGATIVE mg/dL
Specific Gravity, Urine: 1.011 (ref 1.005–1.030)
pH: 7 (ref 5.0–8.0)

## 2022-09-09 MED ORDER — OXYCODONE HCL 5 MG PO TABS
10.0000 mg | ORAL_TABLET | Freq: Once | ORAL | Status: AC
  Administered 2022-09-09: 10 mg via ORAL
  Filled 2022-09-09: qty 2

## 2022-09-09 MED ORDER — CAPSAICIN-MENTHOL-METHYL SAL 0.025-1-12 % EX CREA: 1.0000 | TOPICAL_CREAM | CUTANEOUS | 1 refills | Status: DC | PRN

## 2022-09-09 MED ORDER — CAPSAICIN 0.025 % EX CREA
TOPICAL_CREAM | Freq: Once | CUTANEOUS | Status: AC
  Filled 2022-09-09: qty 60

## 2022-09-09 MED ORDER — ACETAMINOPHEN 500 MG PO TABS
1000.0000 mg | ORAL_TABLET | Freq: Once | ORAL | Status: AC
  Administered 2022-09-09: 1000 mg via ORAL
  Filled 2022-09-09: qty 2

## 2022-09-09 MED ORDER — OXYCODONE HCL 5 MG PO TABS: 5.0000 mg | ORAL_TABLET | Freq: Four times a day (QID) | ORAL | 0 refills | Status: AC | PRN

## 2022-09-09 MED ORDER — CYCLOBENZAPRINE HCL 5 MG PO TABS
5.0000 mg | ORAL_TABLET | Freq: Once | ORAL | Status: AC
  Administered 2022-09-09: 5 mg via ORAL
  Filled 2022-09-09: qty 1

## 2022-09-09 MED ORDER — CYCLOBENZAPRINE HCL 10 MG PO TABS: 10.0000 mg | ORAL_TABLET | Freq: Three times a day (TID) | ORAL | 1 refills | Status: DC | PRN

## 2022-09-09 NOTE — MAU Note (Signed)
Martha Navarro is a 31 y.o. at [redacted]w[redacted]d here in MAU reporting: "severe" lower back pain that began  yesterday @ 0900 this morning.  Reports took Flexeril this morning @ 0650 this morning, no relief noted.  States has tried Tylenol, heat/cold, and soaking, "nothing is working".   Denies VB or LOF.  Endorses +FM, reports less than usual. LMP: N/A Onset of complaint: yesterday Pain score: 10 Vitals:   09/09/22 0955  BP: 110/65  Pulse: 100  Resp: 18  Temp: 97.8 F (36.6 C)  SpO2: 100%     FHT:154 bpm Lab orders placed from triage:   UA

## 2022-09-09 NOTE — MAU Provider Note (Addendum)
History     CSN: [id number]  Arrival date and time: 09/09/22 0932   Event Date/Time   First Provider Initiated Contact with Patient 09/09/22 1019      Chief Complaint  Patient presents with   Back Pain   Martha Navarro is a 31 y.o. G2P1001 at [redacted]w[redacted]d who presents today with left hip/back pain. She states that she had this pain a couple of weeks ago, and then it went away. Yesterday the pain returned and is worse than last time. She denies any contractions, VB or LOF. She reports normal fetal movement.   Back Pain This is a new problem. The current episode started yesterday. The problem occurs constantly. The problem has been gradually worsening since onset. The pain is present in the gluteal. The quality of the pain is described as stabbing and cramping. The pain radiates to the left thigh. The pain is at a severity of 10/10. The pain is The same all the time. Exacerbated by: movement. Risk factors include pregnancy. She has tried analgesics and muscle relaxant for the symptoms. The treatment provided no relief.    OB History     Gravida  2   Para  1   Term  1   Preterm      AB      Living  1      SAB      IAB      Ectopic      Multiple  0   Live Births  1           Past Medical History:  Diagnosis Date   Medical history non-contributory    Pneumothorax     Past Surgical History:  Procedure Laterality Date   NO PAST SURGERIES      Family History  Problem Relation Age of Onset   Liver cancer Maternal Grandmother     Social History   Tobacco Use   Smoking status: Former    Types: Cigarettes    Quit date: 09/01/2011    Years since quitting: 11.0   Smokeless tobacco: Never  Substance Use Topics   Alcohol use: Not Currently    Comment: wine   Drug use: No    Allergies: No Known Allergies  Medications Prior to Admission  Medication Sig Dispense Refill Last Dose   cyclobenzaprine (FLEXERIL) 5 MG tablet Take 5 mg by mouth 3 (three) times  daily as needed for muscle spasms.   09/09/2022 at 0650   docusate sodium (COLACE) 50 MG capsule Take 50 mg by mouth 2 (two) times daily.   09/09/2022 at 0650   ferrous sulfate 324 MG TBEC Take 324 mg by mouth.   09/09/2022    Review of Systems  Musculoskeletal:  Positive for back pain.  All other systems reviewed and are negative.  Physical Exam   Blood pressure 114/71, pulse 98, temperature 97.8 F (36.6 C), temperature source Oral, resp. rate 18, height 5\' 9"  (1.753 m), weight 78.9 kg, SpO2 100 %, unknown if currently breastfeeding.  Physical Exam Constitutional:      Appearance: She is well-developed.  HENT:     Head: Normocephalic.  Eyes:     Pupils: Pupils are equal, round, and reactive to light.  Cardiovascular:     Rate and Rhythm: Normal rate and regular rhythm.     Heart sounds: Normal heart sounds.  Pulmonary:     Effort: Pulmonary effort is normal. No respiratory distress.     Breath sounds: Normal breath sounds.  Abdominal:     Palpations: Abdomen is soft.     Tenderness: There is no abdominal tenderness.  Genitourinary:    Vagina: No bleeding. Vaginal discharge: mucusy.    Comments: External: no lesion Vagina: small amount of white discharge     Musculoskeletal:        General: Normal range of motion.     Cervical back: Normal range of motion and neck supple.  Skin:    General: Skin is warm and dry.  Neurological:     Mental Status: She is alert and oriented to person, place, and time.  Psychiatric:        Mood and Affect: Mood normal.        Behavior: Behavior normal.    NST:  Baseline: 155 Variability: moderate Accels: 15x15 Decels: none Toco: none Reactive/Appropriate for GA   Results for orders placed or performed during the hospital encounter of 09/09/22 (from the past 24 hour(s))  Urinalysis, Routine w reflex microscopic Urine, Clean Catch     Status: None   Collection Time: 09/09/22 10:06 AM  Result Value Ref Range   Color, Urine  YELLOW YELLOW   APPearance CLEAR CLEAR   Specific Gravity, Urine 1.011 1.005 - 1.030   pH 7.0 5.0 - 8.0   Glucose, UA NEGATIVE NEGATIVE mg/dL   Hgb urine dipstick NEGATIVE NEGATIVE   Bilirubin Urine NEGATIVE NEGATIVE   Ketones, ur NEGATIVE NEGATIVE mg/dL   Protein, ur NEGATIVE NEGATIVE mg/dL   Nitrite NEGATIVE NEGATIVE   Leukocytes,Ua NEGATIVE NEGATIVE    MAU Course  Procedures  MDM Provider massaged patients lower back and left hip. There were multiple trigger points along the piriformis and gluteus minimus. Patient instructed to use a tennis ball to self massage these areas at home, and I will also provide some instructions on stretches she can do for this area as well. If pain continues I advised patient to FU with OB for a referral to PT.  Patient has had flexeril, tylenol and 10mg  oxycodone. We also applied capsaicin cream to her back.   Assessment and Plan   1. Spasm of left piriformis muscle   2. Back pain affecting pregnancy in third trimester   3. Pregnancy related hip pain in third trimester, antepartum   4. [redacted] weeks gestation of pregnancy    DC home in stable condition  Stretches and exercises for piriformis given  3rd Trimester precautions PTL precautions  Fetal kick counts RX: oxycodone 5mg  PRN #15, flexeril 10mg  PRN #20 with 1 RF, Capsaicin 0.025% cream PRN  Return to MAU as needed FU with OB as planned   Follow-up Information     , MD Follow up.   Specialty: Obstetrics and Gynecology Why: As scheduled Contact information: [address] [place] [address] [phone]                [place] DNP, CNM  09/09/22  1:18 PM

## 2022-10-24 NOTE — L&D Delivery Note (Signed)
Delivery Note At 11:40 AM a viable female was delivered via Vaginal, Spontaneous (Presentation: Left Occiput Anterior).  APGAR: 9, 9; weight  .   Placenta status: Spontaneous, Intact.  Cord: 3 vessels with the following complications: None.   Anesthesia: Epidural Episiotomy: None Lacerations: 1st degree;Perineal Suture Repair: 3.0 vicryl rapide Est. Blood Loss (mL): 200  Mom to postpartum.  Baby to Couplet care / Skin to Skin.  Carlyon Shadow 11/17/2022, 12:10 PM

## 2022-10-25 LAB — OB RESULTS CONSOLE GBS: GBS: NEGATIVE

## 2022-11-11 ENCOUNTER — Telehealth (HOSPITAL_COMMUNITY): Payer: Self-pay | Admitting: *Deleted

## 2022-11-11 NOTE — Telephone Encounter (Signed)
Preadmission screen  

## 2022-11-16 ENCOUNTER — Telehealth (HOSPITAL_COMMUNITY): Payer: Self-pay | Admitting: *Deleted

## 2022-11-16 NOTE — H&P (Signed)
OB History and Physical Preadmission H&P for scheduled IOL  Martha Navarro is a 32 y.o. female G2P1001 presenting for scheduled elective IOL at [redacted]w[redacted]d.  Pregnancy has been uncomplicated, notable for past history of HSV1, on valtrex.  Rh positive, GBS negative, panorama low risk female.    OB History     Gravida  2   Para  1   Term  1   Preterm      AB      Living  1      SAB      IAB      Ectopic      Multiple  0   Live Births  1          Past Medical History:  Diagnosis Date   Medical history non-contributory    Pneumothorax    Past Surgical History:  Procedure Laterality Date   NO PAST SURGERIES     Family History: family history includes Liver cancer in her maternal grandmother. Social History:  reports that she quit smoking about 11 years ago. Her smoking use included cigarettes. She has never used smokeless tobacco. She reports that she does not currently use alcohol. She reports that she does not use drugs.     Maternal Diabetes: No Genetic Screening: Normal Maternal Ultrasounds/Referrals: Normal Fetal Ultrasounds or other Referrals:  None Maternal Substance Abuse:  No Significant Maternal Medications:  None Significant Maternal Lab Results:  Group B Strep negative Other Comments:  None  Review of Systems - Patient denies fever, chills, SOB, CP, N/V/D.  History   unknown if currently breastfeeding. Exam Physical Exam   Gen: alert, well appearing, no distress Chest: nonlabored breathing CV: no peripheral edema Abdomen: soft, gravid  Ext: no evidence of DVT  Prenatal labs: ABO, Rh: O/Positive/-- (06/28 0000) Antibody: Negative (06/28 0000) Rubella: Immune (06/28 0000) RPR: Nonreactive (06/28 0000)  HBsAg: Negative (06/28 0000)  HIV: Non-reactive (06/28 0000)  GBS: Negative/-- (01/02 0000)   Assessment/Plan: Admit to Labor and Delivery Cytotec for cervical ripening, followed by pitocin, AROM Epidural when desired Anticipate  vaginal delivery   Carlyon Shadow 11/16/2022, 11:21 PM

## 2022-11-16 NOTE — Telephone Encounter (Signed)
Preadmission screen  

## 2022-11-17 ENCOUNTER — Encounter (HOSPITAL_COMMUNITY): Payer: Self-pay | Admitting: Obstetrics and Gynecology

## 2022-11-17 ENCOUNTER — Inpatient Hospital Stay (HOSPITAL_COMMUNITY): Payer: Medicaid Other | Admitting: Anesthesiology

## 2022-11-17 ENCOUNTER — Other Ambulatory Visit: Payer: Self-pay

## 2022-11-17 ENCOUNTER — Inpatient Hospital Stay (HOSPITAL_COMMUNITY): Payer: Medicaid Other

## 2022-11-17 ENCOUNTER — Inpatient Hospital Stay (HOSPITAL_COMMUNITY)
Admission: RE | Admit: 2022-11-17 | Discharge: 2022-11-18 | DRG: 807 | Disposition: A | Discharge status: Home / Self Care | Payer: Medicaid Other | Attending: Obstetrics and Gynecology | Admitting: Obstetrics and Gynecology

## 2022-11-17 DIAGNOSIS — Z87891 Personal history of nicotine dependence: Secondary | ICD-10-CM | POA: Diagnosis not present

## 2022-11-17 DIAGNOSIS — A6000 Herpesviral infection of urogenital system, unspecified: Secondary | ICD-10-CM | POA: Diagnosis present

## 2022-11-17 DIAGNOSIS — O9832 Other infections with a predominantly sexual mode of transmission complicating childbirth: Secondary | ICD-10-CM | POA: Diagnosis present

## 2022-11-17 DIAGNOSIS — Z3490 Encounter for supervision of normal pregnancy, unspecified, unspecified trimester: Principal | ICD-10-CM

## 2022-11-17 DIAGNOSIS — Z3A39 39 weeks gestation of pregnancy: Secondary | ICD-10-CM

## 2022-11-17 DIAGNOSIS — O26893 Other specified pregnancy related conditions, third trimester: Secondary | ICD-10-CM | POA: Diagnosis present

## 2022-11-17 LAB — TYPE AND SCREEN
ABO/RH(D): O POS
Antibody Screen: NEGATIVE

## 2022-11-17 LAB — CBC
HCT: 29.8 % — ABNORMAL LOW (ref 36.0–46.0)
Hemoglobin: 10.1 g/dL — ABNORMAL LOW (ref 12.0–15.0)
MCH: 30.2 pg (ref 26.0–34.0)
MCHC: 33.9 g/dL (ref 30.0–36.0)
MCV: 89.2 fL (ref 80.0–100.0)
Platelets: 253 10*3/uL (ref 150–400)
RBC: 3.34 MIL/uL — ABNORMAL LOW (ref 3.87–5.11)
RDW: 13.7 % (ref 11.5–15.5)
WBC: 7.4 10*3/uL (ref 4.0–10.5)
nRBC: 0 % (ref 0.0–0.2)

## 2022-11-17 LAB — RPR: RPR Ser Ql: NONREACTIVE

## 2022-11-17 MED ORDER — TERBUTALINE SULFATE 1 MG/ML IJ SOLN: 0.2500 mg | Freq: Once | INTRAMUSCULAR | Status: DC | PRN

## 2022-11-17 MED ORDER — LIDOCAINE HCL (PF) 1 % IJ SOLN
INTRAMUSCULAR | Status: DC | PRN
  Administered 2022-11-17: 5 mL via EPIDURAL

## 2022-11-17 MED ORDER — ONDANSETRON HCL 4 MG/2ML IJ SOLN: 4.0000 mg | INTRAMUSCULAR | Status: DC | PRN

## 2022-11-17 MED ORDER — ACETAMINOPHEN 325 MG PO TABS: 650.0000 mg | ORAL_TABLET | ORAL | Status: DC | PRN

## 2022-11-17 MED ORDER — EPHEDRINE 5 MG/ML INJ: 10.0000 mg | INTRAVENOUS | Status: DC | PRN

## 2022-11-17 MED ORDER — OXYCODONE-ACETAMINOPHEN 5-325 MG PO TABS: 1.0000 | ORAL_TABLET | ORAL | Status: DC | PRN

## 2022-11-17 MED ORDER — DIPHENHYDRAMINE HCL 25 MG PO CAPS: 25.0000 mg | ORAL_CAPSULE | Freq: Four times a day (QID) | ORAL | Status: DC | PRN

## 2022-11-17 MED ORDER — SOD CITRATE-CITRIC ACID 500-334 MG/5ML PO SOLN: 30.0000 mL | ORAL | Status: DC | PRN

## 2022-11-17 MED ORDER — MISOPROSTOL 25 MCG QUARTER TABLET
25.0000 ug | ORAL_TABLET | ORAL | Status: DC | PRN
  Administered 2022-11-17: 25 ug via VAGINAL
  Filled 2022-11-17: qty 1

## 2022-11-17 MED ORDER — WITCH HAZEL-GLYCERIN EX PADS: 1.0000 | MEDICATED_PAD | CUTANEOUS | Status: DC | PRN

## 2022-11-17 MED ORDER — PHENYLEPHRINE 80 MCG/ML (10ML) SYRINGE FOR IV PUSH (FOR BLOOD PRESSURE SUPPORT): 80.0000 ug | PREFILLED_SYRINGE | INTRAVENOUS | Status: DC | PRN

## 2022-11-17 MED ORDER — OXYCODONE-ACETAMINOPHEN 5-325 MG PO TABS: 2.0000 | ORAL_TABLET | ORAL | Status: DC | PRN

## 2022-11-17 MED ORDER — ONDANSETRON HCL 4 MG PO TABS: 4.0000 mg | ORAL_TABLET | ORAL | Status: DC | PRN

## 2022-11-17 MED ORDER — COCONUT OIL OIL
1.0000 | TOPICAL_OIL | Status: DC | PRN
  Administered 2022-11-17: 1 via TOPICAL

## 2022-11-17 MED ORDER — LACTATED RINGERS IV SOLN: INTRAVENOUS | Status: DC

## 2022-11-17 MED ORDER — ONDANSETRON HCL 4 MG/2ML IJ SOLN: 4.0000 mg | Freq: Four times a day (QID) | INTRAMUSCULAR | Status: DC | PRN

## 2022-11-17 MED ORDER — ZOLPIDEM TARTRATE 5 MG PO TABS: 5.0000 mg | ORAL_TABLET | Freq: Every evening | ORAL | Status: DC | PRN

## 2022-11-17 MED ORDER — OXYTOCIN BOLUS FROM INFUSION
333.0000 mL | Freq: Once | INTRAVENOUS | Status: AC
  Administered 2022-11-17: 333 mL via INTRAVENOUS

## 2022-11-17 MED ORDER — FENTANYL-BUPIVACAINE-NACL 0.5-0.125-0.9 MG/250ML-% EP SOLN
12.0000 mL/h | EPIDURAL | Status: DC | PRN
  Administered 2022-11-17: 12 mL/h via EPIDURAL
  Filled 2022-11-17: qty 250

## 2022-11-17 MED ORDER — DIBUCAINE (PERIANAL) 1 % EX OINT: 1.0000 | TOPICAL_OINTMENT | CUTANEOUS | Status: DC | PRN

## 2022-11-17 MED ORDER — SENNOSIDES-DOCUSATE SODIUM 8.6-50 MG PO TABS
2.0000 | ORAL_TABLET | ORAL | Status: DC
  Administered 2022-11-17: 2 via ORAL
  Filled 2022-11-17: qty 2

## 2022-11-17 MED ORDER — IBUPROFEN 600 MG PO TABS
600.0000 mg | ORAL_TABLET | Freq: Four times a day (QID) | ORAL | Status: DC
  Administered 2022-11-17 – 2022-11-18 (×4): 600 mg via ORAL
  Filled 2022-11-17 (×4): qty 1

## 2022-11-17 MED ORDER — BENZOCAINE-MENTHOL 20-0.5 % EX AERO
1.0000 | INHALATION_SPRAY | CUTANEOUS | Status: DC | PRN
  Administered 2022-11-17: 1 via TOPICAL
  Filled 2022-11-17: qty 56

## 2022-11-17 MED ORDER — LIDOCAINE HCL (PF) 1 % IJ SOLN: 30.0000 mL | INTRAMUSCULAR | Status: DC | PRN

## 2022-11-17 MED ORDER — HYDROXYZINE HCL 50 MG PO TABS: 50.0000 mg | ORAL_TABLET | Freq: Four times a day (QID) | ORAL | Status: DC | PRN

## 2022-11-17 MED ORDER — TETANUS-DIPHTH-ACELL PERTUSSIS 5-2.5-18.5 LF-MCG/0.5 IM SUSY: 0.5000 mL | PREFILLED_SYRINGE | Freq: Once | INTRAMUSCULAR | Status: DC

## 2022-11-17 MED ORDER — LACTATED RINGERS IV SOLN
500.0000 mL | Freq: Once | INTRAVENOUS | Status: AC
  Administered 2022-11-17: 500 mL via INTRAVENOUS

## 2022-11-17 MED ORDER — SIMETHICONE 80 MG PO CHEW: 80.0000 mg | CHEWABLE_TABLET | ORAL | Status: DC | PRN

## 2022-11-17 MED ORDER — LACTATED RINGERS IV SOLN: 500.0000 mL | INTRAVENOUS | Status: DC | PRN

## 2022-11-17 MED ORDER — OXYTOCIN-SODIUM CHLORIDE 30-0.9 UT/500ML-% IV SOLN
2.5000 [IU]/h | INTRAVENOUS | Status: DC
  Filled 2022-11-17: qty 500

## 2022-11-17 MED ORDER — DIPHENHYDRAMINE HCL 50 MG/ML IJ SOLN: 12.5000 mg | INTRAMUSCULAR | Status: DC | PRN

## 2022-11-17 MED ORDER — OXYTOCIN-SODIUM CHLORIDE 30-0.9 UT/500ML-% IV SOLN
1.0000 m[IU]/min | INTRAVENOUS | Status: DC
  Administered 2022-11-17: 2 m[IU]/min via INTRAVENOUS

## 2022-11-17 MED ORDER — PRENATAL MULTIVITAMIN CH
1.0000 | ORAL_TABLET | Freq: Every day | ORAL | Status: DC
  Administered 2022-11-17 – 2022-11-18 (×2): 1 via ORAL
  Filled 2022-11-17 (×2): qty 1

## 2022-11-17 NOTE — Anesthesia Procedure Notes (Signed)
Epidural Patient location during procedure: OB Start time: 11/17/2022 8:39 AM End time: 11/17/2022 8:54 AM  Staffing Anesthesiologist: Barnet Glasgow, MD Performed: anesthesiologist   Preanesthetic Checklist Completed: patient identified, IV checked, site marked, risks and benefits discussed, surgical consent, monitors and equipment checked, pre-op evaluation and timeout performed  Epidural Patient position: sitting Prep: DuraPrep and site prepped and draped Patient monitoring: continuous pulse ox and blood pressure Approach: midline Location: L3-L4 Injection technique: LOR air  Needle:  Needle type: Tuohy  Needle gauge: 17 G Needle length: 9 cm and 9 Needle insertion depth: 6 cm Catheter type: closed end flexible Catheter size: 19 Gauge Catheter at skin depth: 12 cm Test dose: negative  Assessment Events: blood not aspirated, no cerebrospinal fluid, injection not painful, no injection resistance, no paresthesia and negative IV test  Additional Notes Patient identified. Risks/Benefits/Options discussed with patient including but not limited to bleeding, infection, nerve damage, paralysis, failed block, incomplete pain control, headache, blood pressure changes, nausea, vomiting, reactions to medication both or allergic, itching and postpartum back pain. Confirmed with bedside nurse the patient's most recent platelet count. Confirmed with patient that they are not currently taking any anticoagulation, have any bleeding history or any family history of bleeding disorders. Patient expressed understanding and wished to proceed. All questions were answered. Sterile technique was used throughout the entire procedure. Please see nursing notes for vital signs. Test dose was given through epidural needle and negative prior to continuing to dose epidural or start infusion. Warning signs of high block given to the patient including shortness of breath, tingling/numbness in hands, complete  motor block, or any concerning symptoms with instructions to call for help. Patient was given instructions on fall risk and not to get out of bed. All questions and concerns addressed with instructions to call with any issues.  1 Attempt (S) . Patient tolerated procedure well.

## 2022-11-17 NOTE — Anesthesia Preprocedure Evaluation (Addendum)
Anesthesia Evaluation  Patient identified by MRN, date of birth, ID band Patient awake    Reviewed: Allergy & Precautions, NPO status , Patient's Chart, lab work & pertinent test results  Airway Mallampati: II  TM Distance: >3 FB Neck ROM: Full    Dental no notable dental hx. (+) Teeth Intact, Dental Advisory Given   Pulmonary former smoker   Pulmonary exam normal breath sounds clear to auscultation       Cardiovascular Exercise Tolerance: Good Normal cardiovascular exam Rhythm:Regular Rate:Normal     Neuro/Psych    GI/Hepatic Neg liver ROS,,,  Endo/Other  negative endocrine ROS    Renal/GU negative Renal ROS     Musculoskeletal   Abdominal   Peds  Hematology  (+) Blood dyscrasia, anemia Lab Results      Component                Value               Date                            HGB                      10.1 (L)            11/17/2022                HCT                      29.8 (L)            11/17/2022                PLT                      253                 11/17/2022              Anesthesia Other Findings   Reproductive/Obstetrics (+) Pregnancy                             Anesthesia Physical Anesthesia Plan  ASA: 2  Anesthesia Plan: Epidural   Post-op Pain Management:    Induction:   PONV Risk Score and Plan:   Airway Management Planned:   Additional Equipment: None  Intra-op Plan:   Post-operative Plan:   Informed Consent: I have reviewed the patients History and Physical, chart, labs and discussed the procedure including the risks, benefits and alternatives for the proposed anesthesia with the patient or authorized representative who has indicated his/her understanding and acceptance.     Dental advisory given  Plan Discussed with:   Anesthesia Plan Comments: (39.3 wk G2P1 for LEA)       Anesthesia Quick Evaluation

## 2022-11-17 NOTE — Progress Notes (Signed)
Labor Progress Note  Patient doing well, received 1x dose cytotec and transitioned to low dose pitocin.  Cervix 4/60/-2, head well applied.  AROM completed in standard fashion with return of clear fluid.  Patient tolerated well.  FHT cat 1  Anticipate epidural soon.   Of note, GC/CT result is missing from prenatal records due to being testing just prior to initial OB visit.  GC/CT was negative on 02/18/2022   Martha Navarro

## 2022-11-17 NOTE — Plan of Care (Signed)

## 2022-11-18 LAB — CBC
HCT: 27.7 % — ABNORMAL LOW (ref 36.0–46.0)
Hemoglobin: 9.2 g/dL — ABNORMAL LOW (ref 12.0–15.0)
MCH: 29.9 pg (ref 26.0–34.0)
MCHC: 33.2 g/dL (ref 30.0–36.0)
MCV: 89.9 fL (ref 80.0–100.0)
Platelets: 212 10*3/uL (ref 150–400)
RBC: 3.08 MIL/uL — ABNORMAL LOW (ref 3.87–5.11)
RDW: 13.7 % (ref 11.5–15.5)
WBC: 9 10*3/uL (ref 4.0–10.5)
nRBC: 0 % (ref 0.0–0.2)

## 2022-11-18 MED ORDER — ACETAMINOPHEN 325 MG PO TABS: 650.0000 mg | ORAL_TABLET | ORAL | 0 refills | Status: DC | PRN

## 2022-11-18 MED ORDER — DOCUSATE SODIUM 100 MG PO CAPS: 100.0000 mg | ORAL_CAPSULE | Freq: Every day | ORAL | 0 refills | Status: DC

## 2022-11-18 MED ORDER — FERROUS SULFATE 325 (65 FE) MG PO TABS: 325.0000 mg | ORAL_TABLET | ORAL | 3 refills | Status: DC

## 2022-11-18 MED ORDER — IBUPROFEN 600 MG PO TABS: 600.0000 mg | ORAL_TABLET | Freq: Four times a day (QID) | ORAL | 0 refills | Status: DC

## 2022-11-18 MED ORDER — FERROUS SULFATE 325 (65 FE) MG PO TABS
325.0000 mg | ORAL_TABLET | ORAL | Status: DC
  Administered 2022-11-18: 325 mg via ORAL
  Filled 2022-11-18: qty 1

## 2022-11-18 MED ORDER — DOCUSATE SODIUM 100 MG PO CAPS
100.0000 mg | ORAL_CAPSULE | Freq: Every day | ORAL | Status: DC
  Administered 2022-11-18: 100 mg via ORAL
  Filled 2022-11-18: qty 1

## 2022-11-18 NOTE — Anesthesia Postprocedure Evaluation (Signed)
Anesthesia Post Note  Patient: Martha Navarro  Procedure(s) Performed: AN AD [place]     Patient location during evaluation: Mother Baby Anesthesia Type: Epidural Level of consciousness: awake and alert and oriented Pain management: satisfactory to patient Vital Signs Assessment: post-procedure vital signs reviewed and stable Respiratory status: respiratory function stable Cardiovascular status: stable Postop Assessment: no headache, no backache, epidural receding, patient able to bend at knees, no signs of nausea or vomiting, adequate PO intake and able to ambulate Anesthetic complications: no Comments: The patient stated that she was upset that her epidural only worked on the right side of her body, numbing the right side of her abdomen and rendering her right leg completely immobile. She related that she felt everything on the left.side and was extremely uncomfortable. When questioned if her nurses notified the [employer], she said "no".    No notable events documented.  Last Vitals:  Vitals:   11/17/22 2300 11/18/22 0307  BP: 122/76 108/70  Pulse: 76 84  Resp: 16 16  Temp: 37 C 36.8 C  SpO2: 99% 99%    Last Pain:  Vitals:   11/18/22 0307  TempSrc: Oral  PainSc: 0-No pain   Pain Goal: Patients Stated Pain Goal: 0 (11/17/22 0954)                 Katherina Mires

## 2022-11-18 NOTE — Progress Notes (Signed)
Postpartum Progress Note  Post Partum Day 1 s/p spontaneous vaginal delivery.  Patient reports well-controlled pain, ambulating without difficulty, voiding spontaneously, tolerating PO.  Vaginal bleeding is appropriate.   Objective: Blood pressure 108/70, pulse 84, temperature 98.2 F (36.8 C), temperature source Oral, resp. rate 16, height 5\' 9"  (1.753 m), weight 88.1 kg, SpO2 99 %, unknown if currently breastfeeding.  Physical Exam:  General: alert and no distress Lochia: appropriate Uterine Fundus: firm DVT Evaluation: No evidence of DVT seen on physical exam.  Recent Labs    11/17/22 0017 11/18/22 0419  HGB 10.1* 9.2*  HCT 29.8* 27.7*    Assessment/Plan: Postpartum Day 1, s/p vaginal delivery. Continue routine postpartum care Acute blood loss anemia - Fe/Colace. No signs or symptoms of anemia.  Baby girl Anticipate discharge home today   LOS: 1 day   Carlyon Shadow 11/18/2022, 6:40 AM

## 2022-11-21 NOTE — Discharge Summary (Signed)
Obstetric Discharge Summary  Martha Navarro is a 32 y.o. female that presented on 11/17/2022 for elective IOL at 42 weeks. Her labor course was uncomplicated and she delivered a viable female infant on 11/17/2022.  Her postpartum course was uncomplicated and on PPD#1, she reported well controlled pain, spontaneous voiding, ambulating without difficulty, and tolerating PO.  She was stable for discharge home on 11/18/2022 with plans for in-office follow up.  Hemoglobin  Date Value Ref Range Status  11/18/2022 9.2 (L) 12.0 - 15.0 g/dL Final   HCT  Date Value Ref Range Status  11/18/2022 27.7 (L) 36.0 - 46.0 % Final    Physical Exam:  General: alert and no distress Lochia: appropriate Uterine Fundus: firm DVT Evaluation: No evidence of DVT seen on physical exam.  Discharge Diagnoses: Term Pregnancy-delivered  Discharge Information: Date: 11/21/2022 Activity: Pelvic rest, as tolerated Diet: routine Medications: Tylenol, motrin, iron, colace Condition: stable Instructions: Refer to practice specific booklet.  Discussed prior to discharge.  Discharge to: Home  Follow-up Information     Carlyon Shadow, MD Follow up.   Specialty: Obstetrics and Gynecology Why: Please follow up for a 6 week postpartum visit. Contact information: [place] [address] [phone]                 Newborn Data: Live born female  Birth Weight: 7 lb 7.9 oz (3400 g) APGAR: 2, 9  Newborn Delivery   Birth date/time: 11/17/2022 11:40:10 Delivery type: Vaginal, Spontaneous      Home with mother.  Carlyon Shadow 11/21/2022, 12:55 PM

## 2022-11-26 ENCOUNTER — Telehealth (HOSPITAL_COMMUNITY): Payer: Self-pay | Admitting: *Deleted

## 2022-11-26 NOTE — Telephone Encounter (Signed)
Left phone voicemail message.  Odis Hollingshead, RN 11-26-2022 at 9:43am

## 2023-06-27 ENCOUNTER — Emergency Department (HOSPITAL_BASED_OUTPATIENT_CLINIC_OR_DEPARTMENT_OTHER): Payer: Medicaid Other | Admitting: Radiology

## 2023-06-27 ENCOUNTER — Encounter (HOSPITAL_BASED_OUTPATIENT_CLINIC_OR_DEPARTMENT_OTHER): Payer: Self-pay

## 2023-06-27 ENCOUNTER — Emergency Department (HOSPITAL_BASED_OUTPATIENT_CLINIC_OR_DEPARTMENT_OTHER)
Admission: EM | Admit: 2023-06-27 | Discharge: 2023-06-27 | Disposition: A | Discharge status: Home / Self Care | Payer: Medicaid Other | Attending: Emergency Medicine | Admitting: Emergency Medicine

## 2023-06-27 ENCOUNTER — Other Ambulatory Visit: Payer: Self-pay

## 2023-06-27 DIAGNOSIS — M25562 Pain in left knee: Secondary | ICD-10-CM | POA: Diagnosis present

## 2023-06-27 NOTE — ED Triage Notes (Addendum)
Pt to ED C/O Left knee pain, reports tripped on stair 1 month ago, and has had pain ever since, progressively getting worse. No relief with OTC medications. Ambulatory in triage. Wearing knee sleeve from home

## 2023-06-27 NOTE — ED Notes (Addendum)
Pt d/c home per MD order. Discharge summary reviewed with pt, pt verbalizes understanding. Ambulatory off unit. No s/s of acute distress noted at discharge.  °

## 2023-06-27 NOTE — ED Provider Notes (Signed)
[place] EMERGENCY DEPARTMENT AT [facility] Provider Note   CSN: [id number] Arrival date & time: 06/27/23  4010     History  Chief Complaint  Patient presents with   Knee Injury    Martha Navarro is a 32 y.o. female presents with concern for left knee pain.  She fell onto her left knee on a brick approximately 1 month ago.  Ever since that injury, she has been having worsening right knee pain.  Pain especially when squatting down.  Also notes some catching and giving out of the knee. Has been able to ambulate but notes sometimes she needs a crutch for assistance. Has been trying Tylenol and ibuprofen without much pain relief.  Has also tried a knee sleeve without much improvement in symptoms  HPI     Home Medications Prior to Admission medications   Medication Sig Start Date End Date Taking? Authorizing Provider  acetaminophen (TYLENOL) 325 MG tablet Take 2 tablets (650 mg total) by mouth every 4 (four) hours as needed (for pain scale < 4). 11/18/22   Lyn Henri, MD  docusate sodium (COLACE) 100 MG capsule Take 1 capsule (100 mg total) by mouth daily. 11/18/22   Lyn Henri, MD  docusate sodium (COLACE) 50 MG capsule Take 50 mg by mouth 2 (two) times daily.    [provider]  ferrous sulfate 324 MG TBEC Take 324 mg by mouth.    [provider]  ferrous sulfate 325 (65 FE) MG tablet Take 1 tablet (325 mg total) by mouth every other day. 11/18/22   Lyn Henri, MD  ibuprofen (ADVIL) 600 MG tablet Take 1 tablet (600 mg total) by mouth every 6 (six) hours. 11/18/22   Lyn Henri, MD      Allergies    Patient has no known allergies.    Review of Systems   Review of Systems  Musculoskeletal:        Left knee pain    Physical Exam Updated Vital Signs BP 125/88 (BP Location: Right Arm)   Pulse 94   Temp 98.8 F (37.1 C) (Oral)   Resp 16   Ht 5\' 8"  (1.727 m)   Wt 79.4 kg   SpO2 100%   BMI 26.61 kg/m  Physical  Exam Vitals and nursing note reviewed.  Constitutional:      Appearance: Normal appearance.  HENT:     Head: Atraumatic.  Cardiovascular:     Rate and Rhythm: Normal rate and regular rhythm.     Comments: 2+ dorsalis pedis pulse bilaterally Pulmonary:     Effort: Pulmonary effort is normal.  Musculoskeletal:     Comments: Left knee without any obvious deformity, erythema, or edema. No tenderness to palpation of the quadriceps tendon, patellar tendon, medial or lateral joint lines, LCL, MCL, popliteal fossa. No TTP over the patella or tibia No ligamentous laxity noted on Lachman's or posterior drawer testing, valgus or varus stress of the knee. Pain with McMurray's of the left knee  Neurological:     General: No focal deficit present.     Mental Status: She is alert.     Comments: 5/5 strength of quadracepts bilaterally 5/5 strength knee flexion and extension on the right, 4/5 on the left 5/5 strength with ankle plantarflexion and dorsiflexion on the right, 4/5 on the left  1+ patellar reflex bilaterally   Intact sensation in the feet bilaterally  Psychiatric:        Mood and Affect: Mood normal.  Behavior: Behavior normal.     ED Results / Procedures / Treatments   Labs (all labs ordered are listed, but only abnormal results are displayed) Labs Reviewed - No data to display  EKG None  Radiology DG Knee Complete 4 Views Left  Result Date: 06/27/2023 CLINICAL DATA:  Tripped on brick steps hitting left knee on the brick 1.5 months ago. Pain and swelling. EXAM: LEFT KNEE - COMPLETE 4+ VIEW COMPARISON:  None Available. FINDINGS: Normal bone mineralization. Minimal approximately 1 mm step-off of a small 3 mm cortical density at the far inferior aspect of the patella on lateral view. Mild adjacent anterior soft tissue swelling. No knee joint effusion. Joint spaces are preserved. IMPRESSION: Minimal approximately 1 mm step-off of a small 3 mm cortical density at the far  inferior aspect of the patella. This may represent a tiny superficial cortical fracture near the patellar tendon origin. Recommend clinical correlation for point tenderness. Electronically Signed   By: Neita Garnet M.D.   On: 06/27/2023 11:08    Procedures Procedures    Medications Ordered in ED Medications - No data to display  ED Course/ Medical Decision Making/ A&P                                 Medical Decision Making Amount and/or Complexity of Data Reviewed Radiology: ordered.   32 y.o. female  presents to the ED for concern of left knee pain after striking her knee against a brick 1 month ago  Differential diagnosis includes but is not limited to fracture, dislocation, ACL injury, PCL injury, LCL injury, MCL injury, meniscal injury, bone contusion  ED Course:  Patient fell on a brick with her left knee 1 month ago and has had continued pain since.  She states it is hard for her to walk and will experience catching and locking of the knee and feelings like her knee will give out.  Pain is worse when squatting.  She does not have any signs of fracture or dislocation on x-ray.  X-ray does show a slight step-off of the patella, however, patient does not have any point tenderness here.  Low suspicion for fracture here. She does not have any tenderness to palpation of the knee diffusely, no ligamentous laxity on Lachman's or posterior drawer, valgus or varus stress of the knee.  Low suspicion for ligamentous injury at this time.  However, does have some pain with McMurray's.  Given the history of the catching and locking sensation her knee, and continuation of her symptoms for over a month now, concern for meniscal injury.  I discussed with her that she will need to follow-up with orthopedics as soon as possible for further evaluation.  Discussed that meniscal injuries cannot be seen on x-ray, she will likely need an MRI from an orthopedic provider.  Considered providing patient with a  knee brace, however she already has one   Impression: Left knee pain, possible meniscal injury  Disposition:  The patient was discharged home with instructions to make an appointment with orthopedics as soon as possible for further evaluation and possible MRI.  Wear knee brace for comfort.  Ibuprofen for pain. Return precautions given.   Imaging Studies ordered: I ordered imaging studies including x-ray right knee I independently visualized the imaging with scope of interpretation limited to determining acute life threatening conditions related to emergency care. Imaging showed no fractures or dislocations, joint spaces well-preserved.  I agree with the radiologist interpretation            Final Clinical Impression(s) / ED Diagnoses Final diagnoses:  Left knee pain, unspecified chronicity    Rx / DC Orders ED Discharge Orders     None         Arabella Merles, PA-C 06/27/23 1119    Ernie Avena, MD 06/27/23 1801

## 2023-06-27 NOTE — Discharge Instructions (Addendum)
Your x-rays show no signs of fracture or dislocation. The findings are as below: "IMPRESSION: Minimal approximately 1 mm step-off of a small 3 mm cortical density at the far inferior aspect of the patella. This may represent a tiny superficial cortical fracture near the patellar tendon origin. Recommend clinical correlation for point tenderness."   As discussed, please follow-up with an orthopedic provider as soon as possible.  I have concern for possible meniscus injury which can be further evaluated by MRI by an orthopedic provider.  I have listed the contact information of the orthopedic provider below.  Please try to get an appointment as soon as possible.  You may go here or at any other preferred orthopedic office.  You may continue to use your knee brace for comfort.  You may use up to 800mg  ibuprofen every 8 hours as needed for pain.  Do not exceed 2.4g of ibuprofen per day.  Return to the ER if you develop any unexplained fever, weakness of your leg, loss of bowel or bladder control, any other new or concerning symptoms.

## 2023-06-28 ENCOUNTER — Encounter (HOSPITAL_BASED_OUTPATIENT_CLINIC_OR_DEPARTMENT_OTHER): Payer: Self-pay | Admitting: Student

## 2023-06-28 ENCOUNTER — Ambulatory Visit (INDEPENDENT_AMBULATORY_CARE_PROVIDER_SITE_OTHER): Payer: Medicaid Other | Admitting: Student

## 2023-06-28 ENCOUNTER — Other Ambulatory Visit (HOSPITAL_BASED_OUTPATIENT_CLINIC_OR_DEPARTMENT_OTHER): Payer: Self-pay

## 2023-06-28 DIAGNOSIS — M25562 Pain in left knee: Secondary | ICD-10-CM | POA: Diagnosis not present

## 2023-06-28 MED ORDER — MELOXICAM 15 MG PO TABS
15.0000 mg | ORAL_TABLET | Freq: Every day | ORAL | 0 refills | Status: AC
  Filled 2023-06-28 (×2): qty 14, 14d supply, fill #0

## 2023-06-28 NOTE — Progress Notes (Signed)
Chief Complaint: Left knee pain     History of Present Illness:    Martha Navarro is a 32 y.o. female presenting today for evaluation of left knee pain.  Patient states that she had a fall approximately 1 month ago onto a brick surface.  Since that injury, she has not been able to get much relief.  She states that over the past few days, her knee actually has been feeling worse.  She reports occasional locking and giving out.  Also reports some weakness and swelling.  Pain is located mainly in the front of the knee under the kneecap.  She has been wearing a patellar stabilizing brace which has not helped much.  She is taking ibuprofen 800 mg as needed for pain which is not helping.  She has also tried ice, heat, and topical creams.  Was seen in the emergency department yesterday and x-rays taken of the knee.  She has a 53-month old girl as well as a 29-year-old daughter that keep her busy and active.   Surgical History:   None  PMH/PSH/Family History/Social History/Meds/Allergies:    Past Medical History:  Diagnosis Date   Medical history non-contributory    Pneumothorax    Past Surgical History:  Procedure Laterality Date   NO PAST SURGERIES     Social History   Socioeconomic History   Marital status: Single    Spouse name: Not on file   Number of children: Not on file   Years of education: Not on file   Highest education level: Not on file  Occupational History   Not on file  Tobacco Use   Smoking status: Former    Current packs/day: 0.00    Types: Cigarettes    Quit date: 09/01/2011    Years since quitting: 11.8   Smokeless tobacco: Never  Substance and Sexual Activity   Alcohol use: Not Currently    Comment: wine   Drug use: No   Sexual activity: Not Currently  Other Topics Concern   Not on file  Social History Narrative   Not on file   Social Determinants of Health   Financial Resource Strain: Not on file  Food Insecurity:  No Food Insecurity (11/17/2022)   Hunger Vital Sign    Worried About Running Out of Food in the Last Year: Never true    Ran Out of Food in the Last Year: Never true  Transportation Needs: No Transportation Needs (11/17/2022)   PRAPARE - Administrator, Civil Service (Medical): No    Lack of Transportation (Non-Medical): No  Physical Activity: Not on file  Stress: Not on file  Social Connections: Not on file   Family History  Problem Relation Age of Onset   Liver cancer Maternal Grandmother    No Known Allergies Current Outpatient Medications  Medication Sig Dispense Refill   meloxicam (MOBIC) 15 MG tablet Take 1 tablet (15 mg total) by mouth daily for 14 days. 14 tablet 0   acetaminophen (TYLENOL) 325 MG tablet Take 2 tablets (650 mg total) by mouth every 4 (four) hours as needed (for pain scale < 4). 30 tablet 0   docusate sodium (COLACE) 100 MG capsule Take 1 capsule (100 mg total) by mouth daily. 10 capsule 0   docusate sodium (COLACE) 50 MG capsule Take 50  mg by mouth 2 (two) times daily.     ferrous sulfate 324 MG TBEC Take 324 mg by mouth.     ferrous sulfate 325 (65 FE) MG tablet Take 1 tablet (325 mg total) by mouth every other day. 30 tablet 3   ibuprofen (ADVIL) 600 MG tablet Take 1 tablet (600 mg total) by mouth every 6 (six) hours. 30 tablet 0   No current facility-administered medications for this visit.   DG Knee Complete 4 Views Left  Result Date: 06/27/2023 CLINICAL DATA:  Tripped on brick steps hitting left knee on the brick 1.5 months ago. Pain and swelling. EXAM: LEFT KNEE - COMPLETE 4+ VIEW COMPARISON:  None Available. FINDINGS: Normal bone mineralization. Minimal approximately 1 mm step-off of a small 3 mm cortical density at the far inferior aspect of the patella on lateral view. Mild adjacent anterior soft tissue swelling. No knee joint effusion. Joint spaces are preserved. IMPRESSION: Minimal approximately 1 mm step-off of a small 3 mm cortical density  at the far inferior aspect of the patella. This may represent a tiny superficial cortical fracture near the patellar tendon origin. Recommend clinical correlation for point tenderness. Electronically Signed   By: Neita Garnet M.D.   On: 06/27/2023 11:08    Review of Systems:   A ROS was performed including pertinent positives and negatives as documented in the HPI.  Physical Exam :   Constitutional: NAD and appears stated age Neurological: Alert and oriented Psych: Appropriate affect and cooperative unknown if currently breastfeeding.   Comprehensive Musculoskeletal Exam:    Left knee appears mildly swollen.  Tenderness palpation over the inferior patella and medial joint line.  Active range of motion from 5 degrees extension to 130 degrees flexion.  No instability with varus prior stress.  Negative Lachman.  Positive McMurray.  Distal neurosensory exam intact.  Imaging:   Xray review from emergency department on 06/27/2023 (left knee 4 views): Mild medial joint space narrowing.  Small bony protrusion off the inferior patella suggesting possible fracture.    I personally reviewed and interpreted the radiographs.   Assessment:   32 y.o. female with 1 month history of left knee pain after a fall.  She does have a small possible fracture off the anterior inferior patella which does correlate with tenderness over that area.  I do not believe that this would warrant change in treatment.  I am also concerned for a potential meniscal injury, so I do recommend proceeding with MRI for further evaluation.  Will plan to proceed with this as well as a course of meloxicam for pain and inflammation control.  Patient reports that she is not breast-feeding.  Will plan to see her back after MRI for review to discuss treatment options.  Plan :    -Obtain MRI of the left knee and return to clinic to discuss results -Start meloxicam 15 mg    I personally saw and evaluated the patient, and participated  in the management and treatment plan.  Hazle Nordmann, PA-C Orthopedics

## 2023-06-29 ENCOUNTER — Other Ambulatory Visit (HOSPITAL_COMMUNITY): Payer: Self-pay

## 2023-07-13 ENCOUNTER — Telehealth (HOSPITAL_BASED_OUTPATIENT_CLINIC_OR_DEPARTMENT_OTHER): Payer: Self-pay | Admitting: Student

## 2023-07-13 ENCOUNTER — Other Ambulatory Visit: Payer: Self-pay | Admitting: Orthopaedic Surgery

## 2023-07-13 MED ORDER — MELOXICAM 15 MG PO TABS: 15.0000 mg | ORAL_TABLET | Freq: Every day | ORAL | 1 refills | Status: DC

## 2023-07-13 NOTE — Telephone Encounter (Signed)
Can you advise since Doran not here

## 2023-07-13 NOTE — Telephone Encounter (Signed)
Patient wants to know if you can send in refill for meloxicam.

## 2023-07-15 ENCOUNTER — Ambulatory Visit
Admission: RE | Admit: 2023-07-15 | Discharge: 2023-07-15 | Disposition: A | Discharge status: Home / Self Care | Payer: Medicaid Other | Source: Ambulatory Visit | Attending: Student | Admitting: Student

## 2023-07-15 DIAGNOSIS — M25562 Pain in left knee: Secondary | ICD-10-CM

## 2023-07-20 ENCOUNTER — Ambulatory Visit (HOSPITAL_BASED_OUTPATIENT_CLINIC_OR_DEPARTMENT_OTHER): Payer: Medicaid Other | Admitting: Student

## 2023-07-20 ENCOUNTER — Encounter (HOSPITAL_BASED_OUTPATIENT_CLINIC_OR_DEPARTMENT_OTHER): Payer: Self-pay | Admitting: Student

## 2023-07-20 DIAGNOSIS — S83282A Other tear of lateral meniscus, current injury, left knee, initial encounter: Secondary | ICD-10-CM

## 2023-07-20 NOTE — Progress Notes (Signed)
Chief Complaint: Left knee pain     History of Present Illness:   07/20/23: Martha Navarro is here today for MRI follow-up of her left knee.  Denies many changes in her knee since last visit and states that it is not getting any better.  Continues to have popping, clicking, and buckling.  Rates that pain levels are a 7 out of 10.  Has been taking meloxicam 15 mg as well as Tylenol 650 mg twice a day.  She did unfortunately have a strain of her low back a few days ago, however today this feels much improved.   06/28/2023: Martha Navarro is a 32 y.o. female presenting today for evaluation of left knee pain.  Patient states that she had a fall approximately 1 month ago onto a brick surface.  Since that injury, she has not been able to get much relief.  She states that over the past few days, her knee actually has been feeling worse.  She reports occasional locking and giving out.  Also reports some weakness and swelling.  Pain is located mainly in the front of the knee under the kneecap.  She has been wearing a patellar stabilizing brace which has not helped much.  She is taking ibuprofen 800 mg as needed for pain which is not helping.  She has also tried ice, heat, and topical creams.  Was seen in the emergency department yesterday and x-rays taken of the knee.  She has a 74-month old girl as well as a 57-year-old daughter that keep her busy and active.   Surgical History:   None  PMH/PSH/Family History/Social History/Meds/Allergies:    Past Medical History:  Diagnosis Date   Medical history non-contributory    Pneumothorax    Past Surgical History:  Procedure Laterality Date   NO PAST SURGERIES     Social History   Socioeconomic History   Marital status: Single    Spouse name: Not on file   Number of children: Not on file   Years of education: Not on file   Highest education level: Not on file  Occupational History   Not on file  Tobacco Use   Smoking  status: Former    Current packs/day: 0.00    Types: Cigarettes    Quit date: 09/01/2011    Years since quitting: 11.8   Smokeless tobacco: Never  Substance and Sexual Activity   Alcohol use: Not Currently    Comment: wine   Drug use: No   Sexual activity: Not Currently  Other Topics Concern   Not on file  Social History Narrative   Not on file   Social Determinants of Health   Financial Resource Strain: Not on file  Food Insecurity: No Food Insecurity (11/17/2022)   Hunger Vital Sign    Worried About Running Out of Food in the Last Year: Never true    Ran Out of Food in the Last Year: Never true  Transportation Needs: No Transportation Needs (11/17/2022)   PRAPARE - Administrator, Civil Service (Medical): No    Lack of Transportation (Non-Medical): No  Physical Activity: Not on file  Stress: Not on file  Social Connections: Not on file   Family History  Problem Relation Age of Onset   Liver cancer Maternal Grandmother    No Known Allergies  Current Outpatient Medications  Medication Sig Dispense Refill   meloxicam (MOBIC) 15 MG tablet Take 1 tablet (15 mg total) by mouth daily. 30 tablet 1   acetaminophen (TYLENOL) 325 MG tablet Take 2 tablets (650 mg total) by mouth every 4 (four) hours as needed (for pain scale < 4). 30 tablet 0   docusate sodium (COLACE) 100 MG capsule Take 1 capsule (100 mg total) by mouth daily. 10 capsule 0   docusate sodium (COLACE) 50 MG capsule Take 50 mg by mouth 2 (two) times daily.     ferrous sulfate 324 MG TBEC Take 324 mg by mouth.     ferrous sulfate 325 (65 FE) MG tablet Take 1 tablet (325 mg total) by mouth every other day. 30 tablet 3   ibuprofen (ADVIL) 600 MG tablet Take 1 tablet (600 mg total) by mouth every 6 (six) hours. 30 tablet 0   No current facility-administered medications for this visit.   No results found.  Review of Systems:   A ROS was performed including pertinent positives and negatives as documented in  the HPI.  Physical Exam :   Constitutional: NAD and appears stated age Neurological: Alert and oriented Psych: Appropriate affect and cooperative unknown if currently breastfeeding.   Comprehensive Musculoskeletal Exam:    Left knee is tender to palpation over the inferior aspect of the patella and the lateral joint line.  Active range of motion from 0 to 130 degrees.  No instability with varus or valgus stress.     Imaging:   MRI left knee Small avulsion fracture noted of the inferior patella.  Anterior lateral meniscus tear.    I personally reviewed and interpreted the radiographs.   Assessment:   32 y.o. female with persistent left knee pain after a fall almost 2 months ago.  MRI reviewed today does reveal a lateral meniscus tear as well as a small inferior patella avulsion fracture which does correlate with previous x-ray.  Given that she is having mechanical symptoms and instability as well as persistent pain, I have recommended further discussion with Dr. Steward Drone to consider surgical intervention.  Patient is agreeable to this and would like to proceed with what ever will get her back to 100%.  In the meantime I have encouraged her to continue the meloxicam as well as Tylenol for pain control.   Plan :    -Return to clinic on 08/04/2023 for potential surgical discussion with Dr. Steward Drone    I personally saw and evaluated the patient, and participated in the management and treatment plan.  Hazle Nordmann, PA-C Orthopedics

## 2023-08-04 ENCOUNTER — Ambulatory Visit (HOSPITAL_BASED_OUTPATIENT_CLINIC_OR_DEPARTMENT_OTHER): Payer: Self-pay | Admitting: Orthopaedic Surgery

## 2023-08-04 ENCOUNTER — Other Ambulatory Visit (HOSPITAL_BASED_OUTPATIENT_CLINIC_OR_DEPARTMENT_OTHER): Payer: Self-pay

## 2023-08-04 ENCOUNTER — Ambulatory Visit (HOSPITAL_BASED_OUTPATIENT_CLINIC_OR_DEPARTMENT_OTHER): Payer: Medicaid Other | Admitting: Orthopaedic Surgery

## 2023-08-04 DIAGNOSIS — S83282A Other tear of lateral meniscus, current injury, left knee, initial encounter: Secondary | ICD-10-CM

## 2023-08-04 MED ORDER — IBUPROFEN 800 MG PO TABS
800.0000 mg | ORAL_TABLET | Freq: Three times a day (TID) | ORAL | 0 refills | Status: AC
  Filled 2023-08-04: qty 30, 10d supply, fill #0

## 2023-08-04 MED ORDER — ASPIRIN 325 MG PO TBEC
325.0000 mg | DELAYED_RELEASE_TABLET | Freq: Every day | ORAL | 0 refills | Status: DC
  Filled 2023-08-04: qty 14, 14d supply, fill #0

## 2023-08-04 MED ORDER — OXYCODONE HCL 5 MG PO TABS
5.0000 mg | ORAL_TABLET | ORAL | 0 refills | Status: DC | PRN
Start: 2023-08-04 — End: 2023-11-04
  Filled 2023-08-04: qty 10, 2d supply, fill #0

## 2023-08-04 MED ORDER — ACETAMINOPHEN 500 MG PO TABS
500.0000 mg | ORAL_TABLET | Freq: Three times a day (TID) | ORAL | 0 refills | Status: AC
  Filled 2023-08-04: qty 30, 10d supply, fill #0

## 2023-08-04 NOTE — Progress Notes (Signed)
Chief Complaint: Left knee pain        History of Present Illness:    08/04/2023: Martha Navarro presents today for MRI follow-up of the left knee.  She is experiencing continued buckling and giving way of the left knee with persistent lateral based pain     06/28/2023: Martha Navarro is a 32 y.o. female presenting today for evaluation of left knee pain.  Patient states that she had a fall approximately 1 month ago onto a brick surface.  Since that injury, she has not been able to get much relief.  She states that over the past few days, her knee actually has been feeling worse.  She reports occasional locking and giving out.  Also reports some weakness and swelling.  Pain is located mainly in the front of the knee under the kneecap.  She has been wearing a patellar stabilizing brace which has not helped much.  She is taking ibuprofen 800 mg as needed for pain which is not helping.  She has also tried ice, heat, and topical creams.  Was seen in the emergency department yesterday and x-rays taken of the knee.  She has a 70-month old girl as well as a 53-year-old daughter that keep her busy and active.     Surgical History:   None   PMH/PSH/Family History/Social History/Meds/Allergies:         Past Medical History:  Diagnosis Date   Medical history non-contributory     Pneumothorax               Past Surgical History:  Procedure Laterality Date   NO PAST SURGERIES            Social History         Socioeconomic History   Marital status: Single      Spouse name: Not on file   Number of children: Not on file   Years of education: Not on file   Highest education level: Not on file  Occupational History   Not on file  Tobacco Use   Smoking status: Former      Current packs/day: 0.00      Types: Cigarettes      Quit date: 09/01/2011      Years since quitting: 11.8   Smokeless tobacco: Never  Substance and Sexual Activity   Alcohol use: Not Currently      Comment:  wine   Drug use: No   Sexual activity: Not Currently  Other Topics Concern   Not on file  Social History Narrative   Not on file    Social Determinants of Health        Financial Resource Strain: Not on file  Food Insecurity: No Food Insecurity (11/17/2022)    Hunger Vital Sign     Worried About Running Out of Food in the Last Year: Never true     Ran Out of Food in the Last Year: Never true  Transportation Needs: No Transportation Needs (11/17/2022)    PRAPARE - Therapist, art (Medical): No     Lack of Transportation (Non-Medical): No  Physical Activity: Not on file  Stress: Not on file  Social Connections: Not on file         Family History  Problem Relation Age of Onset   Liver cancer Maternal Grandmother          Allergies  No Known Allergies  Current Outpatient Medications  Medication Sig Dispense Refill   meloxicam (MOBIC) 15 MG tablet Take 1 tablet (15 mg total) by mouth daily. 30 tablet 1   acetaminophen (TYLENOL) 325 MG tablet Take 2 tablets (650 mg total) by mouth every 4 (four) hours as needed (for pain scale < 4). 30 tablet 0   docusate sodium (COLACE) 100 MG capsule Take 1 capsule (100 mg total) by mouth daily. 10 capsule 0   docusate sodium (COLACE) 50 MG capsule Take 50 mg by mouth 2 (two) times daily.       ferrous sulfate 324 MG TBEC Take 324 mg by mouth.       ferrous sulfate 325 (65 FE) MG tablet Take 1 tablet (325 mg total) by mouth every other day. 30 tablet 3   ibuprofen (ADVIL) 600 MG tablet Take 1 tablet (600 mg total) by mouth every 6 (six) hours. 30 tablet 0      No current facility-administered medications for this visit.      Imaging Results (Last 48 hours)  No results found.     Review of Systems:   A ROS was performed including pertinent positives and negatives as documented in the HPI.   Physical Exam :   Constitutional: NAD and appears stated age Neurological: Alert and oriented Psych:  Appropriate affect and cooperative unknown if currently breastfeeding.    Comprehensive Musculoskeletal Exam:     Left knee is tender to palpation over the inferior aspect of the patella and the lateral joint line.  Active range of motion from 0 to 130 degrees.  No instability with varus or valgus stress.       Imaging:   MRI left knee Small avulsion fracture noted of the inferior patella.  Anterior lateral meniscus tear.       I personally reviewed and interpreted the radiographs.     Assessment:   32 y.o. female with persistent left knee pain after a fall almost 2 months ago.  At this time I did describe that she has trialed conservative management including rest activity restriction and anti-inflammatories.  She is not getting any relief from this.  Given that we did discuss the role for possible arthroscopy with lateral meniscal repair.  I did discuss the specific restrictions and limitations associated with this.  After discussion she has elected for left knee arthroscopy with lateral meniscal repair   Plan :     -Plan for left knee arthroscopy with lateral meniscal repair   After a lengthy discussion of treatment options, including risks, benefits, alternatives, complications of surgical and nonsurgical conservative options, the patient elected surgical repair.   The patient  is aware of the material risks  and complications including, but not limited to injury to adjacent structures, neurovascular injury, infection, numbness, bleeding, implant failure, thermal burns, stiffness, persistent pain, failure to heal, disease transmission from allograft, need for further surgery, dislocation, anesthetic risks, blood clots, risks of death,and others. The probabilities of surgical success and failure discussed with patient given their particular co-morbidities.The time and nature of expected rehabilitation and recovery was discussed.The patient's questions were all answered preoperatively.   No barriers to understanding were noted. I explained the natural history of the disease process and Rx rationale.  I explained to the patient what I considered to be reasonable expectations given their personal situation.  The final treatment plan was arrived at through a shared patient decision making process model.        I personally saw and  evaluated the patient, and participated in the management and treatment plan.

## 2023-08-28 ENCOUNTER — Encounter (HOSPITAL_BASED_OUTPATIENT_CLINIC_OR_DEPARTMENT_OTHER): Payer: Self-pay | Admitting: Orthopaedic Surgery

## 2023-09-04 ENCOUNTER — Encounter (HOSPITAL_BASED_OUTPATIENT_CLINIC_OR_DEPARTMENT_OTHER): Payer: Self-pay | Admitting: Orthopaedic Surgery

## 2023-09-04 ENCOUNTER — Ambulatory Visit (HOSPITAL_BASED_OUTPATIENT_CLINIC_OR_DEPARTMENT_OTHER): Payer: Medicaid Other | Admitting: Anesthesiology

## 2023-09-04 ENCOUNTER — Other Ambulatory Visit: Payer: Self-pay

## 2023-09-04 ENCOUNTER — Ambulatory Visit (HOSPITAL_BASED_OUTPATIENT_CLINIC_OR_DEPARTMENT_OTHER)
Admission: RE | Admit: 2023-09-04 | Discharge: 2023-09-04 | Disposition: A | Discharge status: Home / Self Care | Payer: Medicaid Other | Attending: Orthopaedic Surgery | Admitting: Orthopaedic Surgery

## 2023-09-04 ENCOUNTER — Encounter (HOSPITAL_BASED_OUTPATIENT_CLINIC_OR_DEPARTMENT_OTHER)
Admission: RE | Disposition: A | Discharge status: Home / Self Care | Payer: Self-pay | Source: Home / Self Care | Attending: Orthopaedic Surgery

## 2023-09-04 DIAGNOSIS — S83282A Other tear of lateral meniscus, current injury, left knee, initial encounter: Secondary | ICD-10-CM

## 2023-09-04 DIAGNOSIS — W19XXXA Unspecified fall, initial encounter: Secondary | ICD-10-CM | POA: Diagnosis not present

## 2023-09-04 DIAGNOSIS — S83207A Unspecified tear of unspecified meniscus, current injury, left knee, initial encounter: Secondary | ICD-10-CM | POA: Diagnosis not present

## 2023-09-04 DIAGNOSIS — Z87891 Personal history of nicotine dependence: Secondary | ICD-10-CM | POA: Insufficient documentation

## 2023-09-04 DIAGNOSIS — S83262A Peripheral tear of lateral meniscus, current injury, left knee, initial encounter: Secondary | ICD-10-CM | POA: Diagnosis not present

## 2023-09-04 DIAGNOSIS — Z01818 Encounter for other preprocedural examination: Secondary | ICD-10-CM

## 2023-09-04 HISTORY — PX: KNEE ARTHROSCOPY WITH MENISCAL REPAIR: SHX5653

## 2023-09-04 LAB — POCT PREGNANCY, URINE: Preg Test, Ur: NEGATIVE

## 2023-09-04 SURGERY — ARTHROSCOPY, KNEE, WITH MENISCUS REPAIR
Anesthesia: General | Site: Knee | Laterality: Left

## 2023-09-04 MED ORDER — BUPIVACAINE HCL (PF) 0.25 % IJ SOLN
INTRAMUSCULAR | Status: AC
Start: 2023-09-04 — End: ?
  Filled 2023-09-04: qty 30

## 2023-09-04 MED ORDER — MEPERIDINE HCL 25 MG/ML IJ SOLN: 6.2500 mg | INTRAMUSCULAR | Status: DC | PRN

## 2023-09-04 MED ORDER — DEXAMETHASONE SODIUM PHOSPHATE 10 MG/ML IJ SOLN
INTRAMUSCULAR | Status: DC | PRN
  Administered 2023-09-04: 10 mg via INTRAVENOUS

## 2023-09-04 MED ORDER — HYDROCODONE-ACETAMINOPHEN 5-325 MG PO TABS: 1.0000 | ORAL_TABLET | ORAL | 0 refills | Status: DC | PRN

## 2023-09-04 MED ORDER — AMISULPRIDE (ANTIEMETIC) 5 MG/2ML IV SOLN: 10.0000 mg | Freq: Once | INTRAVENOUS | Status: DC | PRN

## 2023-09-04 MED ORDER — MIDAZOLAM HCL 2 MG/2ML IJ SOLN
INTRAMUSCULAR | Status: AC
  Filled 2023-09-04: qty 2

## 2023-09-04 MED ORDER — CEFAZOLIN SODIUM-DEXTROSE 2-4 GM/100ML-% IV SOLN
INTRAVENOUS | Status: AC
  Filled 2023-09-04: qty 100

## 2023-09-04 MED ORDER — DEXMEDETOMIDINE HCL IN NACL 80 MCG/20ML IV SOLN
INTRAVENOUS | Status: DC | PRN
  Administered 2023-09-04 (×3): 4 ug via INTRAVENOUS

## 2023-09-04 MED ORDER — OXYCODONE HCL 5 MG/5ML PO SOLN: 5.0000 mg | Freq: Once | ORAL | Status: DC | PRN

## 2023-09-04 MED ORDER — KETOROLAC TROMETHAMINE 30 MG/ML IJ SOLN
30.0000 mg | Freq: Once | INTRAMUSCULAR | Status: AC | PRN
  Administered 2023-09-04: 30 mg via INTRAVENOUS

## 2023-09-04 MED ORDER — LIDOCAINE 2% (20 MG/ML) 5 ML SYRINGE
INTRAMUSCULAR | Status: AC
  Filled 2023-09-04: qty 5

## 2023-09-04 MED ORDER — DEXAMETHASONE SODIUM PHOSPHATE 10 MG/ML IJ SOLN
INTRAMUSCULAR | Status: AC
  Filled 2023-09-04: qty 1

## 2023-09-04 MED ORDER — PROPOFOL 10 MG/ML IV BOLUS
INTRAVENOUS | Status: AC
  Filled 2023-09-04: qty 20

## 2023-09-04 MED ORDER — GABAPENTIN 300 MG PO CAPS
ORAL_CAPSULE | ORAL | Status: AC
  Filled 2023-09-04: qty 1

## 2023-09-04 MED ORDER — CEFAZOLIN SODIUM-DEXTROSE 2-3 GM-%(50ML) IV SOLR
INTRAVENOUS | Status: DC | PRN
  Administered 2023-09-04: 2 g via INTRAVENOUS

## 2023-09-04 MED ORDER — HYDROMORPHONE HCL 1 MG/ML IJ SOLN
INTRAMUSCULAR | Status: AC
  Filled 2023-09-04: qty 0.5

## 2023-09-04 MED ORDER — FENTANYL CITRATE (PF) 100 MCG/2ML IJ SOLN
INTRAMUSCULAR | Status: AC
  Filled 2023-09-04: qty 2

## 2023-09-04 MED ORDER — HYDROMORPHONE HCL 1 MG/ML IJ SOLN
0.2500 mg | INTRAMUSCULAR | Status: DC | PRN
  Administered 2023-09-04: 0.5 mg via INTRAVENOUS

## 2023-09-04 MED ORDER — ACETAMINOPHEN 500 MG PO TABS: 1000.0000 mg | ORAL_TABLET | Freq: Once | ORAL | Status: DC

## 2023-09-04 MED ORDER — SODIUM CHLORIDE 0.9 % IR SOLN
Status: DC | PRN
  Administered 2023-09-04: 3000 mL

## 2023-09-04 MED ORDER — KETOROLAC TROMETHAMINE 30 MG/ML IJ SOLN
INTRAMUSCULAR | Status: AC
  Filled 2023-09-04: qty 1

## 2023-09-04 MED ORDER — TRANEXAMIC ACID-NACL 1000-0.7 MG/100ML-% IV SOLN
INTRAVENOUS | Status: AC
Start: 2023-09-04 — End: ?
  Filled 2023-09-04: qty 100

## 2023-09-04 MED ORDER — CEFAZOLIN SODIUM-DEXTROSE 2-4 GM/100ML-% IV SOLN: 2.0000 g | INTRAVENOUS | Status: DC

## 2023-09-04 MED ORDER — OXYCODONE HCL 5 MG PO TABS: 5.0000 mg | ORAL_TABLET | Freq: Once | ORAL | Status: DC | PRN

## 2023-09-04 MED ORDER — GABAPENTIN 300 MG PO CAPS
300.0000 mg | ORAL_CAPSULE | Freq: Once | ORAL | Status: AC
Start: 2023-09-04 — End: 2023-09-04
  Administered 2023-09-04: 300 mg via ORAL

## 2023-09-04 MED ORDER — LACTATED RINGERS IV SOLN: INTRAVENOUS | Status: DC

## 2023-09-04 MED ORDER — PROPOFOL 10 MG/ML IV BOLUS
INTRAVENOUS | Status: DC | PRN
  Administered 2023-09-04: 200 ug via INTRAVENOUS

## 2023-09-04 MED ORDER — ONDANSETRON HCL 4 MG/2ML IJ SOLN
INTRAMUSCULAR | Status: DC | PRN
  Administered 2023-09-04: 4 mg via INTRAVENOUS

## 2023-09-04 MED ORDER — ONDANSETRON HCL 4 MG/2ML IJ SOLN: 4.0000 mg | Freq: Once | INTRAMUSCULAR | Status: DC | PRN

## 2023-09-04 MED ORDER — ACETAMINOPHEN 500 MG PO TABS
1000.0000 mg | ORAL_TABLET | Freq: Once | ORAL | Status: AC
Start: 2023-09-04 — End: 2023-09-04
  Administered 2023-09-04: 1000 mg via ORAL

## 2023-09-04 MED ORDER — FENTANYL CITRATE (PF) 100 MCG/2ML IJ SOLN
INTRAMUSCULAR | Status: DC | PRN
  Administered 2023-09-04: 100 ug via INTRAVENOUS

## 2023-09-04 MED ORDER — TRANEXAMIC ACID-NACL 1000-0.7 MG/100ML-% IV SOLN
1000.0000 mg | INTRAVENOUS | Status: AC
Start: 2023-09-04 — End: 2023-09-04
  Administered 2023-09-04: 1000 mg via INTRAVENOUS

## 2023-09-04 MED ORDER — ONDANSETRON HCL 4 MG/2ML IJ SOLN
INTRAMUSCULAR | Status: AC
  Filled 2023-09-04: qty 2

## 2023-09-04 MED ORDER — ACETAMINOPHEN 500 MG PO TABS
ORAL_TABLET | ORAL | Status: AC
  Filled 2023-09-04: qty 2

## 2023-09-04 MED ORDER — EPINEPHRINE PF 1 MG/ML IJ SOLN
INTRAMUSCULAR | Status: AC
  Filled 2023-09-04: qty 2

## 2023-09-04 MED ORDER — LIDOCAINE 2% (20 MG/ML) 5 ML SYRINGE
INTRAMUSCULAR | Status: DC | PRN
  Administered 2023-09-04: 60 mg via INTRAVENOUS

## 2023-09-04 MED ORDER — MIDAZOLAM HCL 2 MG/2ML IJ SOLN
INTRAMUSCULAR | Status: DC | PRN
  Administered 2023-09-04: 2 mg via INTRAVENOUS

## 2023-09-04 SURGICAL SUPPLY — 62 items
APL PRP STRL LF DISP 70% ISPRP (MISCELLANEOUS) ×1
BANDAGE ESMARK 6X9 LF (GAUZE/BANDAGES/DRESSINGS) IMPLANT
BLADE EXCALIBUR 4.0X13 (MISCELLANEOUS) IMPLANT
BLADE SURG 15 STRL LF DISP TIS (BLADE) IMPLANT
BLADE SURG 15 STRL SS (BLADE)
BNDG CMPR 5X4 KNIT ELC UNQ LF (GAUZE/BANDAGES/DRESSINGS) ×1
BNDG CMPR 6 X 5 YARDS HK CLSR (GAUZE/BANDAGES/DRESSINGS) ×1
BNDG CMPR 9X6 STRL LF SNTH (GAUZE/BANDAGES/DRESSINGS)
BNDG ELASTIC 4INX 5YD STR LF (GAUZE/BANDAGES/DRESSINGS) ×1 IMPLANT
BNDG ELASTIC 6INX 5YD STR LF (GAUZE/BANDAGES/DRESSINGS) ×1 IMPLANT
BNDG ESMARK 6X9 LF (GAUZE/BANDAGES/DRESSINGS)
CHLORAPREP W/TINT 26 (MISCELLANEOUS) ×1 IMPLANT
COOLER ICEMAN CLASSIC (MISCELLANEOUS) ×1 IMPLANT
CUFF TOURN SGL QUICK 34 (TOURNIQUET CUFF) ×1
CUFF TRNQT CYL 34X4.125X (TOURNIQUET CUFF) IMPLANT
DISSECTOR 3.8MM X 13CM (MISCELLANEOUS) ×1 IMPLANT
DRAPE ARTHROSCOPY W/POUCH 90 (DRAPES) ×1 IMPLANT
DRAPE IMP U-DRAPE 54X76 (DRAPES) ×1 IMPLANT
DRAPE INCISE IOBAN 66X45 STRL (DRAPES) IMPLANT
DRAPE U-SHAPE 47X51 STRL (DRAPES) ×1 IMPLANT
DW OUTFLOW CASSETTE/TUBE SET (MISCELLANEOUS) ×1 IMPLANT
ELECT REM PT RETURN 9FT ADLT (ELECTROSURGICAL)
ELECTRODE REM PT RTRN 9FT ADLT (ELECTROSURGICAL) IMPLANT
EXCALIBUR 3.8MM X 13CM (MISCELLANEOUS) IMPLANT
GAUZE 4X4 16PLY ~~LOC~~+RFID DBL (SPONGE) IMPLANT
GAUZE PAD ABD 8X10 STRL (GAUZE/BANDAGES/DRESSINGS) ×1 IMPLANT
GAUZE SPONGE 4X4 12PLY STRL (GAUZE/BANDAGES/DRESSINGS) ×1 IMPLANT
GAUZE XEROFORM 1X8 LF (GAUZE/BANDAGES/DRESSINGS) ×1 IMPLANT
GLOVE BIO SURGEON STRL SZ 6 (GLOVE) ×1 IMPLANT
GLOVE BIO SURGEON STRL SZ7.5 (GLOVE) ×1 IMPLANT
GLOVE BIOGEL PI IND STRL 6.5 (GLOVE) ×1 IMPLANT
GLOVE BIOGEL PI IND STRL 8 (GLOVE) ×1 IMPLANT
GOWN STRL REUS W/ TWL LRG LVL3 (GOWN DISPOSABLE) ×2 IMPLANT
GOWN STRL REUS W/ TWL XL LVL3 (GOWN DISPOSABLE) ×1 IMPLANT
GOWN STRL REUS W/TWL LRG LVL3 (GOWN DISPOSABLE) ×2
GOWN STRL REUS W/TWL XL LVL3 (GOWN DISPOSABLE) ×1
LOOP 2 FIBERLINK CLOSED (SUTURE) IMPLANT
MANAGER SUT NOVOCUT (CUTTER) IMPLANT
MANIFOLD NEPTUNE II (INSTRUMENTS) ×1 IMPLANT
NDL HYPO 18GX1.5 BLUNT FILL (NEEDLE) ×1 IMPLANT
NDL SAFETY ECLIPSE 18X1.5 (NEEDLE) ×1 IMPLANT
NDL SUT 2-0 SCORPION KNEE (NEEDLE) IMPLANT
NEEDLE HYPO 18GX1.5 BLUNT FILL (NEEDLE) ×1
NEEDLE SUT 2-0 SCORPION KNEE (NEEDLE)
PACK ARTHROSCOPY DSU (CUSTOM PROCEDURE TRAY) ×1 IMPLANT
PACK BASIN DAY SURGERY FS (CUSTOM PROCEDURE TRAY) ×1 IMPLANT
PAD COLD SHLDR WRAP-ON (PAD) ×1 IMPLANT
PADDING CAST COTTON 6X4 STRL (CAST SUPPLIES) ×1 IMPLANT
PENCIL SMOKE EVACUATOR (MISCELLANEOUS) IMPLANT
SET PASSER SUT MMII (INSTRUMENTS) IMPLANT
SLEEVE SCD COMPRESS KNEE MED (STOCKING) ×1 IMPLANT
SUCTION TUBE FRAZIER 10FR DISP (SUCTIONS) ×1 IMPLANT
SUT ETHILON 3 0 PS 1 (SUTURE) ×1 IMPLANT
SUT PDS AB 0 CT 36 (SUTURE) IMPLANT
SUT VIC AB 2-0 SH 27 (SUTURE)
SUT VIC AB 2-0 SH 27XBRD (SUTURE) IMPLANT
SUT VIC AB 3-0 FS2 27 (SUTURE) IMPLANT
SYR 5ML LL (SYRINGE) ×1 IMPLANT
TOWEL GREEN STERILE FF (TOWEL DISPOSABLE) ×2 IMPLANT
TRAY DSU PREP LF (CUSTOM PROCEDURE TRAY) ×1 IMPLANT
TUBING ARTHROSCOPY IRRIG 16FT (MISCELLANEOUS) ×1 IMPLANT
WAND ABLATOR APOLLO I90 (BUR) ×1 IMPLANT

## 2023-09-04 NOTE — Interval H&P Note (Signed)
History and Physical Interval Note:  09/04/2023 11:41 AM  Martha Navarro  has presented today for surgery, with the diagnosis of LEFT LATERAL MENISCUS TEAR.  The various methods of treatment have been discussed with the patient and family. After consideration of risks, benefits and other options for treatment, the patient has consented to  Procedure(s): LEFT KNEE ARTHROSCOPY WITH LATERAL MENISCAL REPAIR (Left) as a surgical intervention.  The patient's history has been reviewed, patient examined, no change in status, stable for surgery.  I have reviewed the patient's chart and labs.  Questions were answered to the patient's satisfaction.     Huel Cote

## 2023-09-04 NOTE — Discharge Instructions (Addendum)
Discharge Instructions    Attending Surgeon: Huel Cote, MD Office Phone Number: [phone]   Diagnosis and Procedures:    Surgeries Performed: Left knee lateral meniscal repair  Discharge Plan:    Diet: Resume usual diet. Begin with light or bland foods.  Drink plenty of fluids.  Activity:  Weight bearing as tolerated left knee. You are advised to go home directly from the hospital or surgical center. Restrict your activities.  GENERAL INSTRUCTIONS: 1.  Please apply ice to your wound to help with swelling and inflammation. This will improve your comfort and your overall recovery following surgery.     2. Please call Dr. Serena Croissant office at [phone] with questions Monday-Friday during business hours. If no one answers, please leave a message and someone should get back to the patient within 24 hours. For emergencies please call 911 or proceed to the emergency room.   3. Patient to notify surgical team if experiences any of the following: Bowel/Bladder dysfunction, uncontrolled pain, nerve/muscle weakness, incision with increased drainage or redness, nausea/vomiting and Fever greater than 101.0 F.  Be alert for signs of infection including redness, streaking, odor, fever or chills. Be alert for excessive pain or bleeding and notify your surgeon immediately.  WOUND INSTRUCTIONS:   Leave your dressing, cast, or splint in place until your post operative visit.  Keep it clean and dry.  Always keep the incision clean and dry until the staples/sutures are removed. If there is no drainage from the incision you should keep it open to air. If there is drainage from the incision you must keep it covered at all times until the drainage stops  Do not soak in a bath tub, hot tub, pool, lake or other body of water until 21 days after your surgery and your incision is completely dry and healed.  If you have removable sutures (or staples) they must be removed 10-14 days (unless  otherwise instructed) from the day of your surgery.     1)  Elevate the extremity as much as possible.  2)  Keep the dressing clean and dry.  3)  Please call us if the dressing becomes wet or dirty.  4)  If you are experiencing worsening pain or worsening swelling, please call.     MEDICATIONS: Resume all previous home medications at the previous prescribed dose and frequency unless otherwise noted Start taking the  pain medications on an as-needed basis as prescribed  Please taper down pain medication over the next week following surgery.  Ideally you should not require a refill of any narcotic pain medication.  Take pain medication with food to minimize nausea. In addition to the prescribed pain medication, you may take over-the-counter pain relievers such as Tylenol.  Do NOT take additional tylenol if your pain medication already has tylenol in it.  Aspirin 325mg  daily per instructions on bottle. Narcotic policy: Per [facility] clinic policy, our goal is ensure optimal postoperative pain control with a multimodal pain management strategy. For all [facility] patients, our goal is to wean post-operative narcotic medications by 6 weeks post-operatively, and many times sooner. If this is not possible due to utilization of pain medication prior to surgery, your [facility] doctor will support your acute post-operative pain control for the first 6 weeks postoperatively, with a plan to transition you back to your primary pain team following that. Cyndia Skeeters will work to ensure a Therapist, occupational.       FOLLOWUP INSTRUCTIONS: 1. Follow up at the Physical  Therapy Clinic 3-4 days following surgery. This appointment should be scheduled unless other arrangements have been made.The Physical Therapy scheduling number is [phone] if an appointment has not already been arranged.  2. Contact Dr. Serena Croissant office during office hours at [phone] or the practice after hours line at [phone] for  non-emergencies. For medical emergencies call 911.   Discharge Location: Home   Post Anesthesia Home Care Instructions  Activity: Get plenty of rest for the remainder of the day. A responsible individual must stay with you for 24 hours following the procedure.  For the next 24 hours, DO NOT: -Drive a car -Advertising copywriter -Drink alcoholic beverages -Take any medication unless instructed by your physician -Make any legal decisions or sign important papers.  Meals: Start with liquid foods such as gelatin or soup. Progress to regular foods as tolerated. Avoid greasy, spicy, heavy foods. If nausea and/or vomiting occur, drink only clear liquids until the nausea and/or vomiting subsides. Call your physician if vomiting continues.  Special Instructions/Symptoms: Your throat may feel dry or sore from the anesthesia or the breathing tube placed in your throat during surgery. If this causes discomfort, gargle with warm salt water. The discomfort should disappear within 24 hours.  If you had a scopolamine patch placed behind your ear for the management of post- operative nausea and/or vomiting:  1. The medication in the patch is effective for 72 hours, after which it should be removed.  Wrap patch in a tissue and discard in the trash. Wash hands thoroughly with soap and water. 2. You may remove the patch earlier than 72 hours if you experience unpleasant side effects which may include dry mouth, dizziness or visual disturbances. 3. Avoid touching the patch. Wash your hands with soap and water after contact with the patch.    No ibuprofen until after 7:30pm tonight if needed.

## 2023-09-04 NOTE — Anesthesia Procedure Notes (Signed)
Procedure Name: LMA Insertion Date/Time: 09/04/2023 11:59 AM  Performed by: Alvera Novel, CRNAPre-anesthesia Checklist: Patient identified, Emergency Drugs available, Suction available and Patient being monitored Patient Re-evaluated:Patient Re-evaluated prior to induction Oxygen Delivery Method: Circle System Utilized Preoxygenation: Pre-oxygenation with 100% oxygen Induction Type: IV induction Ventilation: Mask ventilation without difficulty LMA: LMA inserted LMA Size: 4.0 Number of attempts: 1 Airway Equipment and Method: Bite block Placement Confirmation: positive ETCO2 Tube secured with: Tape Dental Injury: Teeth and Oropharynx as per pre-operative assessment

## 2023-09-04 NOTE — H&P (Signed)
Chief Complaint: Left knee pain        History of Present Illness:    08/04/2023: Martha Navarro presents today for MRI follow-up of the left knee.  She is experiencing continued buckling and giving way of the left knee with persistent lateral based pain     06/28/2023: Martha Navarro is a 32 y.o. female presenting today for evaluation of left knee pain.  Patient states that she had a fall approximately 1 month ago onto a brick surface.  Since that injury, she has not been able to get much relief.  She states that over the past few days, her knee actually has been feeling worse.  She reports occasional locking and giving out.  Also reports some weakness and swelling.  Pain is located mainly in the front of the knee under the kneecap.  She has been wearing a patellar stabilizing brace which has not helped much.  She is taking ibuprofen 800 mg as needed for pain which is not helping.  She has also tried ice, heat, and topical creams.  Was seen in the emergency department yesterday and x-rays taken of the knee.  She has a 70-month old girl as well as a 53-year-old daughter that keep her busy and active.     Surgical History:   None   PMH/PSH/Family History/Social History/Meds/Allergies:         Past Medical History:  Diagnosis Date   Medical history non-contributory     Pneumothorax               Past Surgical History:  Procedure Laterality Date   NO PAST SURGERIES            Social History         Socioeconomic History   Marital status: Single      Spouse name: Not on file   Number of children: Not on file   Years of education: Not on file   Highest education level: Not on file  Occupational History   Not on file  Tobacco Use   Smoking status: Former      Current packs/day: 0.00      Types: Cigarettes      Quit date: 09/01/2011      Years since quitting: 11.8   Smokeless tobacco: Never  Substance and Sexual Activity   Alcohol use: Not Currently      Comment:  wine   Drug use: No   Sexual activity: Not Currently  Other Topics Concern   Not on file  Social History Narrative   Not on file    Social Determinants of Health        Financial Resource Strain: Not on file  Food Insecurity: No Food Insecurity (11/17/2022)    Hunger Vital Sign     Worried About Running Out of Food in the Last Year: Never true     Ran Out of Food in the Last Year: Never true  Transportation Needs: No Transportation Needs (11/17/2022)    PRAPARE - Therapist, art (Medical): No     Lack of Transportation (Non-Medical): No  Physical Activity: Not on file  Stress: Not on file  Social Connections: Not on file         Family History  Problem Relation Age of Onset   Liver cancer Maternal Grandmother          Allergies  No Known Allergies  Current Outpatient Medications  Medication Sig Dispense Refill   meloxicam (MOBIC) 15 MG tablet Take 1 tablet (15 mg total) by mouth daily. 30 tablet 1   acetaminophen (TYLENOL) 325 MG tablet Take 2 tablets (650 mg total) by mouth every 4 (four) hours as needed (for pain scale < 4). 30 tablet 0   docusate sodium (COLACE) 100 MG capsule Take 1 capsule (100 mg total) by mouth daily. 10 capsule 0   docusate sodium (COLACE) 50 MG capsule Take 50 mg by mouth 2 (two) times daily.       ferrous sulfate 324 MG TBEC Take 324 mg by mouth.       ferrous sulfate 325 (65 FE) MG tablet Take 1 tablet (325 mg total) by mouth every other day. 30 tablet 3   ibuprofen (ADVIL) 600 MG tablet Take 1 tablet (600 mg total) by mouth every 6 (six) hours. 30 tablet 0      No current facility-administered medications for this visit.      Imaging Results (Last 48 hours)  No results found.     Review of Systems:   A ROS was performed including pertinent positives and negatives as documented in the HPI.   Physical Exam :   Constitutional: NAD and appears stated age Neurological: Alert and oriented Psych:  Appropriate affect and cooperative unknown if currently breastfeeding.    Comprehensive Musculoskeletal Exam:     Left knee is tender to palpation over the inferior aspect of the patella and the lateral joint line.  Active range of motion from 0 to 130 degrees.  No instability with varus or valgus stress.       Imaging:   MRI left knee Small avulsion fracture noted of the inferior patella.  Anterior lateral meniscus tear.       I personally reviewed and interpreted the radiographs.     Assessment:   32 y.o. female with persistent left knee pain after a fall almost 2 months ago.  At this time I did describe that she has trialed conservative management including rest activity restriction and anti-inflammatories.  She is not getting any relief from this.  Given that we did discuss the role for possible arthroscopy with lateral meniscal repair.  I did discuss the specific restrictions and limitations associated with this.  After discussion she has elected for left knee arthroscopy with lateral meniscal repair   Plan :     -Plan for left knee arthroscopy with lateral meniscal repair   After a lengthy discussion of treatment options, including risks, benefits, alternatives, complications of surgical and nonsurgical conservative options, the patient elected surgical repair.   The patient  is aware of the material risks  and complications including, but not limited to injury to adjacent structures, neurovascular injury, infection, numbness, bleeding, implant failure, thermal burns, stiffness, persistent pain, failure to heal, disease transmission from allograft, need for further surgery, dislocation, anesthetic risks, blood clots, risks of death,and others. The probabilities of surgical success and failure discussed with patient given their particular co-morbidities.The time and nature of expected rehabilitation and recovery was discussed.The patient's questions were all answered preoperatively.   No barriers to understanding were noted. I explained the natural history of the disease process and Rx rationale.  I explained to the patient what I considered to be reasonable expectations given their personal situation.  The final treatment plan was arrived at through a shared patient decision making process model.        I personally saw and  evaluated the patient, and participated in the management and treatment plan.

## 2023-09-04 NOTE — Anesthesia Preprocedure Evaluation (Addendum)
Anesthesia Evaluation  Patient identified by MRN, date of birth, ID band Patient awake    Reviewed: Allergy & Precautions, NPO status , Patient's Chart, lab work & pertinent test results  Airway Mallampati: I  TM Distance: >3 FB Neck ROM: Full    Dental  (+) Teeth Intact, Dental Advisory Given   Pulmonary former smoker   Pulmonary exam normal breath sounds clear to auscultation       Cardiovascular negative cardio ROS Normal cardiovascular exam Rhythm:Regular Rate:Normal     Neuro/Psych negative neurological ROS  negative psych ROS   GI/Hepatic negative GI ROS, Neg liver ROS,,,  Endo/Other  negative endocrine ROS    Renal/GU negative Renal ROS  negative genitourinary   Musculoskeletal L knee lateral meniscus tear    Abdominal   Peds  Hematology negative hematology ROS (+)   Anesthesia Other Findings   Reproductive/Obstetrics                             Anesthesia Physical Anesthesia Plan  ASA: 1  Anesthesia Plan: General   Post-op Pain Management: Tylenol PO (pre-op)*, Toradol IV (intra-op)* and Precedex   Induction: Intravenous  PONV Risk Score and Plan: 3 and Ondansetron, Dexamethasone, Midazolam and Treatment may vary due to age or medical condition  Airway Management Planned: LMA  Additional Equipment: None  Intra-op Plan:   Post-operative Plan: Extubation in OR  Informed Consent: I have reviewed the patients History and Physical, chart, labs and discussed the procedure including the risks, benefits and alternatives for the proposed anesthesia with the patient or authorized representative who has indicated his/her understanding and acceptance.     Dental advisory given  Plan Discussed with: CRNA  Anesthesia Plan Comments:        Anesthesia Quick Evaluation

## 2023-09-04 NOTE — Brief Op Note (Signed)
   Brief Op Note  Date of Surgery: 09/04/2023  Preoperative Diagnosis: LEFT LATERAL MENISCUS TEAR  Postoperative Diagnosis: same  Procedure: Procedure(s): LEFT KNEE ARTHROSCOPY WITH LATERAL MENISCAL REPAIR  Implants: * No implants in log *  Surgeons: Surgeon(s): Huel Cote, MD  Anesthesia: General    Estimated Blood Loss: See anesthesia record  Complications: None  Condition to PACU: Stable  Benancio Deeds, MD 09/04/2023 1:10 PM

## 2023-09-04 NOTE — Op Note (Signed)
Date of Surgery: 09/04/2023  INDICATIONS: Martha Navarro is a 32 y.o.-year-old female with left knee anterior meniscal root tear.  The risk and benefits of the procedure were discussed in detail and documented in the pre-operative evaluation.   PREOPERATIVE DIAGNOSIS: 1.  Left knee anterior lateral meniscal repair  POSTOPERATIVE DIAGNOSIS: Same.  PROCEDURE: 1.  Left knee lateral meniscal root repair  SURGEON: Martha Deeds MD  ASSISTANT: Kerby Less, ATC  ANESTHESIA:  general  IV FLUIDS AND URINE: See anesthesia record.  ANTIBIOTICS: Ancef  ESTIMATED BLOOD LOSS: 10 mL.  IMPLANTS:  * No implants in log *  DRAINS: None  CULTURES: None  COMPLICATIONS: none  DESCRIPTION OF PROCEDURE:  Examination under anesthesia: A careful examination under anesthesia was performed.  Knee ROM motion was: -3-135 Lachman: Normal Pivot Shift: Normal Posterior drawer: normal.   Varus stability in full extension: normal.   Varus stability in 30 degrees of flexion: normal.  Valgus stability in full extension: normal.   Valgus stability in 30 degrees of flexion: normal.  Posterolateral drawer: normal   Intra-operative findings: A thorough arthroscopic examination of the knee was performed.  The findings are: 1. Suprapatellar pouch: Normal 2. Undersurface of median ridge: Normal 3. Medial patellar facet: Normal 4. Lateral patellar facet: Normal 5. Trochlea: Normal 6. Lateral gutter/popliteus tendon: Normal 7. Hoffa's fat pad: Normal 8. Medial gutter/plica: Normal 9. ACL: Normal 10. PCL: Normal 11. Medial meniscus: Normal 12. Medial compartment cartilage: Normal 13. Lateral meniscus: Horizontal cleavage type tear to the root of the lateral meniscus without root detachment 14. Lateral compartment cartilage: Normal  I identified the patient in the pre-operative holding area.  I marked the operative knee with my initials. I reviewed the risks and benefits of the proposed surgical  intervention and the patient wished to proceed.  Anesthesia performed a peripheral nerve block.  Patient was subsequently taken back to the operating room.  The patient was transferred to the operative suite and placed in the supine position with all bony prominences padded.     SCDs were placed on the non-operative lower extremity. Appropriate antibiotics was administered within 1 hour before incision. The operative lower extremity was then prepped and draped in standard fashion. A time out was performed confirming the correct extremity, correct patient and correct procedure.    A standard anterolateral portal was made with an 11 blade.  The ideal position for the anteromedial portal was established using a spinal needle.  This AM portal was then created under direct visualization with an 11 blade.  A full diagnostic arthroscopy was then performed, as described above, including probing of the chondral and meniscal surfaces.     The meniscus root repair was then performed.  A probe was introduced into the horizontal component of the anterior horn of the lateral meniscus.  This was probed and found to not be detached.  A shaver was introduced into the horizontal cleavage area and debridement.  At this time outside in technique was used in the lateral portal by introducing a PDS up and around the tear.  This was tied with an arthroscopic output today.  This had excellent apposition   A tibial tunnel was create. That concluded the case.  Skin was closed with 3-0 nylon. Xeroform gauze, gauze, Tegaderm, Iceman and brace were applied.  Instrument, sponge, and needle counts were correct prior to wound closure and at the conclusion of the case.  The patient was taken to the PACU without complication  POSTOPERATIVE PLAN: She will be weightbearing as tolerated.  She will begin physical therapy with a meniscal repair protocol.  I will plan to see her back in 2 weeks for suture removal.  She will place on  aspirin for DVT prophylaxis  Martha Deeds, MD 1:10 PM

## 2023-09-04 NOTE — Anesthesia Postprocedure Evaluation (Signed)
Anesthesia Post Note  Patient: Chavely Garity  Procedure(s) Performed: LEFT KNEE ARTHROSCOPY WITH LATERAL MENISCAL REPAIR (Left: Knee)     Patient location during evaluation: PACU Anesthesia Type: General Level of consciousness: awake and alert, oriented and patient cooperative Pain management: pain level controlled Vital Signs Assessment: post-procedure vital signs reviewed and stable Respiratory status: spontaneous breathing, nonlabored ventilation and respiratory function stable Cardiovascular status: blood pressure returned to baseline and stable Postop Assessment: no apparent nausea or vomiting Anesthetic complications: no   No notable events documented.  Last Vitals:  Vitals:   09/04/23 1315 09/04/23 1330  BP: 114/78 114/80  Pulse: 66 62  Resp: 15 14  Temp:    SpO2: 98% 97%    Last Pain:  Vitals:   09/04/23 1300  TempSrc:   PainSc: 6                  Lannie Fields

## 2023-09-04 NOTE — Transfer of Care (Signed)
Immediate Anesthesia Transfer of Care Note  Patient: Martha Navarro  Procedure(s) Performed: LEFT KNEE ARTHROSCOPY WITH LATERAL MENISCAL REPAIR (Left: Knee)  Patient Location: PACU  Anesthesia Type:General  Level of Consciousness: awake and alert   Airway & Oxygen Therapy: Patient Spontanous Breathing and Patient connected to face mask oxygen  Post-op Assessment: Report given to RN and Post -op Vital signs reviewed and stable  Post vital signs: Reviewed and stable  Last Vitals:  Vitals Value Taken Time  BP 112/76   Temp    Pulse 77 09/04/23 1255  Resp 15 09/04/23 1255  SpO2 100 % 09/04/23 1255  Vitals shown include unfiled device data.  Last Pain:  Vitals:   09/04/23 0943  TempSrc: Oral  PainSc: 5       Patients Stated Pain Goal: 5 (09/04/23 0943)  Complications: No notable events documented.

## 2023-09-05 ENCOUNTER — Encounter (HOSPITAL_BASED_OUTPATIENT_CLINIC_OR_DEPARTMENT_OTHER): Payer: Self-pay | Admitting: Orthopaedic Surgery

## 2023-09-08 ENCOUNTER — Ambulatory Visit (HOSPITAL_BASED_OUTPATIENT_CLINIC_OR_DEPARTMENT_OTHER): Payer: Medicaid Other | Attending: Orthopaedic Surgery | Admitting: Physical Therapy

## 2023-09-08 DIAGNOSIS — M25562 Pain in left knee: Secondary | ICD-10-CM | POA: Insufficient documentation

## 2023-09-08 DIAGNOSIS — M25662 Stiffness of left knee, not elsewhere classified: Secondary | ICD-10-CM | POA: Insufficient documentation

## 2023-09-08 DIAGNOSIS — R262 Difficulty in walking, not elsewhere classified: Secondary | ICD-10-CM | POA: Insufficient documentation

## 2023-09-08 DIAGNOSIS — M6281 Muscle weakness (generalized): Secondary | ICD-10-CM | POA: Insufficient documentation

## 2023-09-13 ENCOUNTER — Ambulatory Visit (HOSPITAL_BASED_OUTPATIENT_CLINIC_OR_DEPARTMENT_OTHER): Payer: Medicaid Other | Admitting: Physical Therapy

## 2023-09-13 ENCOUNTER — Encounter (HOSPITAL_BASED_OUTPATIENT_CLINIC_OR_DEPARTMENT_OTHER): Payer: Medicaid Other | Admitting: Orthopaedic Surgery

## 2023-09-13 ENCOUNTER — Telehealth (HOSPITAL_BASED_OUTPATIENT_CLINIC_OR_DEPARTMENT_OTHER): Payer: Self-pay | Admitting: Physical Therapy

## 2023-09-13 NOTE — Telephone Encounter (Signed)
LVM- therapist will need to leave today and need to RS appt. Requested call back.   Martha Navarro PT, DPT 09/13/23 7:59 AM

## 2023-09-14 ENCOUNTER — Encounter (HOSPITAL_BASED_OUTPATIENT_CLINIC_OR_DEPARTMENT_OTHER): Payer: Self-pay | Admitting: Student

## 2023-09-14 ENCOUNTER — Other Ambulatory Visit: Payer: Self-pay

## 2023-09-14 ENCOUNTER — Ambulatory Visit (INDEPENDENT_AMBULATORY_CARE_PROVIDER_SITE_OTHER): Payer: Medicaid Other | Admitting: Student

## 2023-09-14 ENCOUNTER — Encounter (HOSPITAL_BASED_OUTPATIENT_CLINIC_OR_DEPARTMENT_OTHER): Payer: Self-pay | Admitting: Physical Therapy

## 2023-09-14 ENCOUNTER — Ambulatory Visit (HOSPITAL_BASED_OUTPATIENT_CLINIC_OR_DEPARTMENT_OTHER): Payer: Medicaid Other | Admitting: Physical Therapy

## 2023-09-14 DIAGNOSIS — M25562 Pain in left knee: Secondary | ICD-10-CM

## 2023-09-14 DIAGNOSIS — M25662 Stiffness of left knee, not elsewhere classified: Secondary | ICD-10-CM | POA: Diagnosis not present

## 2023-09-14 DIAGNOSIS — M6281 Muscle weakness (generalized): Secondary | ICD-10-CM | POA: Diagnosis not present

## 2023-09-14 DIAGNOSIS — R262 Difficulty in walking, not elsewhere classified: Secondary | ICD-10-CM

## 2023-09-14 DIAGNOSIS — S83282A Other tear of lateral meniscus, current injury, left knee, initial encounter: Secondary | ICD-10-CM

## 2023-09-14 NOTE — Progress Notes (Signed)
Post Operative Evaluation    Procedure/Date of Surgery: Left knee arthroscopy with meniscal repair -09/04/2023  Interval History:   Patient is here today 10 days status post left arthroscopic meniscal repair.  She reports doing extremely well.  Denies any pain and is not currently taking any pain medications.  Has been icing the knee.  States that she has no pain or difficulty with weightbearing.  Also denies any mechanical symptoms.  She is scheduled to begin physical therapy today.   PMH/PSH/Family History/Social History/Meds/Allergies:    Past Medical History:  Diagnosis Date   Medical history non-contributory    Pneumothorax    Past Surgical History:  Procedure Laterality Date   KNEE ARTHROSCOPY WITH MENISCAL REPAIR Left 09/04/2023   Procedure: LEFT KNEE ARTHROSCOPY WITH LATERAL MENISCAL REPAIR;  Surgeon: Huel Cote, MD;  Location: [facility];  Service: Orthopedics;  Laterality: Left;   NO PAST SURGERIES     Social History   Socioeconomic History   Marital status: Single    Spouse name: Not on file   Number of children: Not on file   Years of education: Not on file   Highest education level: Not on file  Occupational History   Not on file  Tobacco Use   Smoking status: Former    Current packs/day: 0.00    Types: Cigarettes    Quit date: 09/01/2011    Years since quitting: 12.0   Smokeless tobacco: Never  Vaping Use   Vaping status: Some Days  Substance and Sexual Activity   Alcohol use: Not Currently    Comment: wine   Drug use: No   Sexual activity: Not Currently  Other Topics Concern   Not on file  Social History Narrative   Not on file   Social Determinants of Health   Financial Resource Strain: Not on file  Food Insecurity: No Food Insecurity (11/17/2022)   Hunger Vital Sign    Worried About Running Out of Food in the Last Year: Never true    Ran Out of Food in the Last Year: Never true   Transportation Needs: No Transportation Needs (11/17/2022)   PRAPARE - Administrator, Civil Service (Medical): No    Lack of Transportation (Non-Medical): No  Physical Activity: Not on file  Stress: Not on file  Social Connections: Not on file   Family History  Problem Relation Age of Onset   Liver cancer Maternal Grandmother    No Known Allergies Current Outpatient Medications  Medication Sig Dispense Refill   aspirin EC 325 MG tablet Take 1 tablet (325 mg total) by mouth daily. (Patient not taking: Reported on 09/14/2023) 14 tablet 0   HYDROcodone-acetaminophen (NORCO/VICODIN) 5-325 MG tablet Take 1 tablet by mouth every 4 (four) hours as needed for moderate pain (pain score 4-6). (Patient not taking: Reported on 09/14/2023) 20 tablet 0   ibuprofen (ADVIL) 600 MG tablet Take 1 tablet (600 mg total) by mouth every 6 (six) hours. (Patient not taking: Reported on 09/14/2023) 30 tablet 0   meloxicam (MOBIC) 15 MG tablet Take 1 tablet (15 mg total) by mouth daily. (Patient not taking: Reported on 09/14/2023) 30 tablet 1   oxyCODONE (ROXICODONE) 5 MG immediate release tablet Take 1 tablet (5 mg total) by mouth every 4 (four) hours as needed for severe  pain or breakthrough pain. (Patient not taking: Reported on 09/14/2023) 10 tablet 0   No current facility-administered medications for this visit.   No results found.  Review of Systems:   A ROS was performed including pertinent positives and negatives as documented in the HPI.   Musculoskeletal Exam:    Last menstrual period 08/28/2023, unknown if currently breastfeeding.  Left knee arthroscopic incisions are well-appearing without evidence of erythema or drainage.  Small seroma palpable underlying lateral incision.  Active range of motion from 0 to 100 degrees.  No calf tenderness or swelling.  Imaging:    Assessment:   10 days status post left knee arthroscopic meniscal repair overall doing extremely well.  Her range of  motion is progressing well.  She has her brace with her today although she is not currently utilizing it.  I did educate on use of the brace as well as avoiding deep flexion of the knee to protect the repair.  Will begin with physical therapy today and continue progression per protocol.  Will have her return in 4 weeks for next post op visit.  Plan :    - Return to clinic in 4 weeks for reassessment      I personally saw and evaluated the patient, and participated in the management and treatment plan.  Hazle Nordmann, PA-C Orthopedics

## 2023-09-14 NOTE — Therapy (Addendum)
OUTPATIENT PHYSICAL THERAPY LOWER EXTREMITY EVALUATION   Patient Name: Martha Navarro MRN: [id number] DOB:02-May-1991, 32 y.o., female Today's Date: 09/15/2023  END OF SESSION:  PT End of Session - 09/14/23 1113     Visit Number 1    Number of Visits 28    Date for PT Re-Evaluation 12/07/23    Authorization Type [facility]    PT Start Time 1025    PT Stop Time 1107    PT Time Calculation (min) 42 min    Activity Tolerance Patient tolerated treatment well    Behavior During Therapy WFL for tasks assessed/performed             Past Medical History:  Diagnosis Date   Medical history non-contributory    Pneumothorax    Past Surgical History:  Procedure Laterality Date   KNEE ARTHROSCOPY WITH MENISCAL REPAIR Left 09/04/2023   Procedure: LEFT KNEE ARTHROSCOPY WITH LATERAL MENISCAL REPAIR;  Surgeon: Huel Cote, MD;  Location: [facility];  Service: Orthopedics;  Laterality: Left;   NO PAST SURGERIES     Patient Active Problem List   Diagnosis Date Noted   Tear of lateral meniscus of left knee, current 09/04/2023   Pregnancy 11/17/2022   Normal labor 09/29/2016   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   Anemia affecting first pregnancy 09/29/2016   Elevated TSH level - on Levothyroxine 09/29/2016   Rubella immune status not known 09/29/2016   Spontaneous pneumothorax 09/13/2011   Arthralgia 09/13/2011     REFERRING PROVIDER: Huel Cote, MD  REFERRING DIAG: [phone] (ICD-10-CM) - Tear of lateral meniscus of left knee, current, unspecified tear type, initial encounter  THERAPY DIAG:  Left knee pain, unspecified chronicity  Muscle weakness (generalized)  Stiffness of left knee, not elsewhere classified  Difficulty in walking, not elsewhere classified  Rationale for Evaluation and Treatment: Rehabilitation  ONSET DATE: DOS 09/04/2023  SUBJECTIVE:   SUBJECTIVE STATEMENT: Pt fell onto her knee on a brick in June/July.  Her pain  worsened and she went to ER on 9/3.  Pt had x rays which possibly showed a slight step-off of the patella which may represent a tiny superfical cortical fx near patellar tendon origin.  Note indicated concern for meniscal injury.  Pt referred to orthopedics who ordered a MRI.  MRI showed a lateral meniscal tear and a small inferior patella avulsion fx.  Pt underwent L knee lateral meniscal root repar on 09/04/23.  POSTOPERATIVE PLAN on post op note indicated: She will be weightbearing as tolerated.  She will begin physical therapy with a meniscal repair protocol.  Pt states she tried crutches, but really didn't use them.  She felt ok ambulating without crutches.  Pt states her knee is feeling better since surgery.  Pt saw MD this AM and had stitches removed.      She is limiting with ambulation distance, but is not having pain with ambulation.  Pt is limited with playing with baby including not getting on the floor.  Pt is unable to squat.  Pt has difficulty with stairs.  Pt is unable to play with her older daughter including recreational activities.    PERTINENT HISTORY: L knee lateral meniscal root repair on 09/04/23  MRI (07/15/23) findings prior to surgery:  Tiny avulsion fracture of the inferior patella with associated small partial tear of the proximal patellar tendon.  PAIN:  NPRS:  0/10 current and best, 3/10 worst: Location:  L knee  PRECAUTIONS: Other: per meniscus repair protocol  RED FLAGS: None   WEIGHT BEARING RESTRICTIONS: Yes WBAT  FALLS:  Has patient fallen in last 6 months? 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   PLOF: Independent.  Pt was able to play with children and perform recreational activities independently without difficulty.   PATIENT GOALS: improve strength, return to PLOF  NEXT MD VISIT:  10/11/2023  OBJECTIVE:  Note: Objective measures were completed at Evaluation unless otherwise noted.  DIAGNOSTIC FINDINGS:  Pt is post op. MRI on 9/21 prior to surgery: IMPRESSION: 1. Tiny avulsion fracture of the inferior patella with associated small partial tear of the proximal patellar tendon. 2. Small longitudinal tear of the lateral meniscus anterior root.  PATIENT SURVEYS:  LEFS 46/80  COGNITION: Overall cognitive status: Within functional limits for tasks assessed     OBSERVATION: Portals healing and intact without stitches.  Portals have no signs of infection.  EDEMA:  Girth measurement at mid patella:  R:  40.0, L:  40.4 cm    LOWER EXTREMITY ROM:   ROM Right eval Left eval  Hip flexion    Hip extension    Hip abduction    Hip adduction    Hip internal rotation    Hip external rotation    Knee flexion 127   Knee extension 6 deg of hyperextension Full at rest  Ankle dorsiflexion    Ankle plantarflexion    Ankle inversion    Ankle eversion     (Blank rows = not tested)  LOWER EXTREMITY MMT:  Not formally tested due to surgical protocol and healing constraints   GAIT: Comments: Pt entered clinic without crutches.  She ambulated well without crutches without significantly favoring L LE.  She had no LOB and denies pain.   TODAY'S TREATMENT:                                                                                                                                 Pt performed ankle pumps x 20 reps and Quad sets with 5 sec hold x 10 reps.  PT instructed pt in correct form.  PT received a HEP handout and was educated in correct form and appropriate frequency.   PATIENT EDUCATION:  Education details: dx, ice usage, HEP, relevant anatomy, post op and protocol limitations and restrictions, and POC.  PT educated pt in WBAT restrictions including when needing AD.  Person educated: Patient Education method: Explanation, Demonstration, Tactile cues,  Verbal cues, and Handouts Education comprehension: verbalized understanding, returned demonstration, verbal cues required, and tactile cues required  HOME EXERCISE PROGRAM: Access Code: [email or web address] URL: [email or web address] Date: 09/14/2023 Prepared by: Aaron Edelman  Exercises - Supine Quadricep Sets  - 2 x daily - 7 x weekly - 2 sets - 10 reps - 5 seconds hold - ANKLE PUMPS  -  3 x daily - 7 x weekly - 3 sets - 10 reps  ASSESSMENT:  CLINICAL IMPRESSION: Patient is a 32 y.o. female 1 week and 3 days s/p L knee lateral meniscal root repair presenting to the clinic with expected post op limitations of L knee pain, limited ROM in L knee, muscle weakness in L LE, and difficulty in walking.  She is limiting with her normal functional mobility skills including ambulation and stairs.  Pt is limited with playing with her children and unable to perform recreational activities.  Pt is WBAT and is currently ambulating without AD without pain.  She should benefit from skilled PT services per protocol to address impairments and to return to desired level of function.     OBJECTIVE IMPAIRMENTS: decreased activity tolerance, decreased endurance, decreased mobility, difficulty walking, decreased ROM, decreased strength, hypomobility, increased edema, impaired flexibility, and pain.   ACTIVITY LIMITATIONS: lifting, bending, standing, squatting, stairs, transfers, locomotion level, and caring for others  PARTICIPATION LIMITATIONS: meal prep, cleaning, laundry, shopping, and community activity  PERSONAL FACTORS:    REHAB POTENTIAL: Good  CLINICAL DECISION MAKING: Stable/uncomplicated  EVALUATION COMPLEXITY: Low   GOALS:   SHORT TERM GOALS:  Pt will be independent and compliant with HEP for improved pain, strength, ROM, and function.  Baseline: Goal status: INITIAL Target date:  10/12/2023   2.  Pt will demo L knee PROM/AAROM 0 - 90 deg for improved stiffness and mobility.   Baseline:  Goal status: INITIAL Target date:  10/12/2023  3.  Pt will demo a good quad set and be able to perform supine SLR independently with no > than minimal extensor lag for improved quad strength and gait  Baseline:  Goal status: INITIAL Target date:  10/12/2023  4.  Pt will progress with exercises per protocol without adverse effects for improved strength and mobility.  Baseline:  Goal status: INITIAL Target date: 10/26/2023     5.  Pt will demo L knee AROM to 0 to 115-120 deg for improved mobility.  Baseline:  Goal status: INITIAL Target date:  11/30/2023   6.  Pt will be able to perform a 6 inch step up with good form and control.  Baseline:  Goal status: INITIAL Target date:  11/09/2023  7.  Pt will ambulate with a normalized heel to toe gait pattern without limping.             Baseline:            Goals status:  INITIAL            Target date:  10/26/2023    LONG TERM GOALS: Target date: 01/04/2024   Pt will be able to perform her ADLs/IADLs without significant pain and difficulty.  Baseline:  Goal status: INITIAL  2.  Pt will demo good L knee control on stairs and be able to perform stairs with a reciprocal gait with the rail.  Baseline:  Goal status: INITIAL  3.  Pt will ambulate extended community distance without difficulty and significant L knee pain  Baseline:  Goal status: INITIAL  4.  Pt will be able to take care of her children and perform activities with her children without significant pain.  Baseline:  Goal status: INITIAL  5.  Pt will demo 5/5 strength in L hip flex, abd, and ext and at least 4+/5 strength in L knee flex and extension for improved performance of and tolerance with functional mobility  Baseline:  Goal status: INITIAL  PLAN:  PT FREQUENCY:  1x/wk x 3-4 weeks, 1-2x/wk x 2 weeks, and 2x/wk afterwards  PT DURATION: other: 14-16 weeks  PLANNED INTERVENTIONS: 97164- PT Re-evaluation, 97110-Therapeutic exercises, 97530-  Therapeutic activity, [id number]- Neuromuscular re-education, 97535- Self Care, 41324- Manual therapy, [id number]- Gait training, [phone]- Aquatic Therapy, 97014- Electrical stimulation (unattended), [id number]- Electrical stimulation (manual), [id number]- Ultrasound, Patient/Family education, Balance training, Stair training, Taping, Dry Needling, Joint mobilization, Scar mobilization, Cryotherapy, and Moist heat  PLAN FOR NEXT SESSION: Cont with Dr. Serena Croissant meniscus repair protocol. Ice as needed.   For all possible CPT codes, reference the Planned Interventions line above.     Check all conditions that are expected to impact treatment: {Conditions expected to impact treatment:None of these apply   If treatment provided at initial evaluation, no treatment charged due to lack of authorization.       Audie Clear III PT, DPT 09/15/23 7:22 AM

## 2023-09-19 ENCOUNTER — Ambulatory Visit (HOSPITAL_BASED_OUTPATIENT_CLINIC_OR_DEPARTMENT_OTHER): Payer: Medicaid Other | Admitting: Physical Therapy

## 2023-09-26 ENCOUNTER — Encounter (HOSPITAL_BASED_OUTPATIENT_CLINIC_OR_DEPARTMENT_OTHER): Payer: Medicaid Other | Admitting: Physical Therapy

## 2023-10-02 ENCOUNTER — Telehealth (HOSPITAL_BASED_OUTPATIENT_CLINIC_OR_DEPARTMENT_OTHER): Payer: Self-pay | Admitting: Physical Therapy

## 2023-10-02 ENCOUNTER — Ambulatory Visit (HOSPITAL_BASED_OUTPATIENT_CLINIC_OR_DEPARTMENT_OTHER): Payer: Medicaid Other | Attending: Orthopaedic Surgery | Admitting: Physical Therapy

## 2023-10-02 DIAGNOSIS — M25562 Pain in left knee: Secondary | ICD-10-CM | POA: Insufficient documentation

## 2023-10-02 DIAGNOSIS — R262 Difficulty in walking, not elsewhere classified: Secondary | ICD-10-CM | POA: Insufficient documentation

## 2023-10-02 DIAGNOSIS — M25662 Stiffness of left knee, not elsewhere classified: Secondary | ICD-10-CM | POA: Insufficient documentation

## 2023-10-02 DIAGNOSIS — M6281 Muscle weakness (generalized): Secondary | ICD-10-CM | POA: Insufficient documentation

## 2023-10-02 NOTE — Telephone Encounter (Signed)
Left voice mail regarding 2nd no-show appointment. Patient advised of attendance policy and asked toplease call within 24 hours if she can not make her appointment on Friday.

## 2023-10-04 ENCOUNTER — Encounter (HOSPITAL_BASED_OUTPATIENT_CLINIC_OR_DEPARTMENT_OTHER): Payer: Medicaid Other | Admitting: Physical Therapy

## 2023-10-06 ENCOUNTER — Encounter (HOSPITAL_BASED_OUTPATIENT_CLINIC_OR_DEPARTMENT_OTHER): Payer: Medicaid Other | Admitting: Physical Therapy

## 2023-10-09 ENCOUNTER — Encounter (HOSPITAL_BASED_OUTPATIENT_CLINIC_OR_DEPARTMENT_OTHER): Payer: Medicaid Other | Admitting: Physical Therapy

## 2023-10-11 ENCOUNTER — Encounter (HOSPITAL_BASED_OUTPATIENT_CLINIC_OR_DEPARTMENT_OTHER): Payer: Medicaid Other | Admitting: Physical Therapy

## 2023-10-11 ENCOUNTER — Ambulatory Visit (INDEPENDENT_AMBULATORY_CARE_PROVIDER_SITE_OTHER): Payer: Medicaid Other | Admitting: Physical Therapy

## 2023-10-11 ENCOUNTER — Ambulatory Visit (HOSPITAL_BASED_OUTPATIENT_CLINIC_OR_DEPARTMENT_OTHER): Payer: Medicaid Other | Admitting: Orthopaedic Surgery

## 2023-10-11 DIAGNOSIS — M25562 Pain in left knee: Secondary | ICD-10-CM

## 2023-10-11 DIAGNOSIS — S83282A Other tear of lateral meniscus, current injury, left knee, initial encounter: Secondary | ICD-10-CM

## 2023-10-11 NOTE — Therapy (Signed)
  OUTPATIENT PHYSICAL THERAPY SCREEN @[address]   Patient Name: Martha Navarro MRN: [id number] DOB:[birth year], 32 y.o., female Today's Date: 10/11/2023  END OF SESSION:  PT End of Session - 10/11/23 0937     Visit Number 1    Activity Tolerance Patient tolerated treatment well    Behavior During Therapy [facility] for tasks assessed/performed             Past Medical History:  Diagnosis Date   Medical history non-contributory    Pneumothorax    Past Surgical History:  Procedure Laterality Date   KNEE ARTHROSCOPY WITH MENISCAL REPAIR Left 09/04/2023   Procedure: LEFT KNEE ARTHROSCOPY WITH LATERAL MENISCAL REPAIR;  Surgeon: Huel Cote, MD;  Location: [facility];  Service: Orthopedics;  Laterality: Left;   NO PAST SURGERIES     Patient Active Problem List   Diagnosis Date Noted   Tear of lateral meniscus of left knee, current 09/04/2023   Pregnancy 11/17/2022   Normal labor 09/29/2016   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   Medbridge HEP code:  [id number] [email or web address]  Clinical Impression & Plan:  Pt is post op but has difficulty with attending OP rehab due to child care and not feeling comfortable in a busy environment. HEP focused on things she can easily do with the baby and within her ADLs. Encouraged her to reach out with any further questions.    Army Fossa PT, DPT 10/11/2023, 9:38 AM  [address] [place], [place] [address] [phone]   Note: charges not applied for screen.

## 2023-10-11 NOTE — Progress Notes (Signed)
Post Operative Evaluation    Procedure/Date of Surgery: Left knee lateral meniscal repair 11/11  Interval History:   Presents today 1 month status post left knee lateral meniscal repair.  She does have some weakness on the side although is continuing to work through range of motion.  Overall she is continuing to improve slowly.  She has not been able to attend physical therapy significantly due to constraints with her child at home.   PMH/PSH/Family History/Social History/Meds/Allergies:    Past Medical History:  Diagnosis Date   Medical history non-contributory    Pneumothorax    Past Surgical History:  Procedure Laterality Date   KNEE ARTHROSCOPY WITH MENISCAL REPAIR Left 09/04/2023   Procedure: LEFT KNEE ARTHROSCOPY WITH LATERAL MENISCAL REPAIR;  Surgeon: Huel Cote, MD;  Location: [facility];  Service: Orthopedics;  Laterality: Left;   NO PAST SURGERIES     Social History   Socioeconomic History   Marital status: Single    Spouse name: Not on file   Number of children: Not on file   Years of education: Not on file   Highest education level: Not on file  Occupational History   Not on file  Tobacco Use   Smoking status: Former    Current packs/day: 0.00    Types: Cigarettes    Quit date: 09/01/2011    Years since quitting: 12.1   Smokeless tobacco: Never  Vaping Use   Vaping status: Some Days  Substance and Sexual Activity   Alcohol use: Not Currently    Comment: wine   Drug use: No   Sexual activity: Not Currently  Other Topics Concern   Not on file  Social History Narrative   Not on file   Social Drivers of Health   Financial Resource Strain: Not on file  Food Insecurity: No Food Insecurity (11/17/2022)   Hunger Vital Sign    Worried About Running Out of Food in the Last Year: Never true    Ran Out of Food in the Last Year: Never true  Transportation Needs: No Transportation Needs (11/17/2022)    PRAPARE - Administrator, Civil Service (Medical): No    Lack of Transportation (Non-Medical): No  Physical Activity: Not on file  Stress: Not on file  Social Connections: Not on file   Family History  Problem Relation Age of Onset   Liver cancer Maternal Grandmother    No Known Allergies Current Outpatient Medications  Medication Sig Dispense Refill   aspirin EC 325 MG tablet Take 1 tablet (325 mg total) by mouth daily. (Patient not taking: Reported on 09/14/2023) 14 tablet 0   HYDROcodone-acetaminophen (NORCO/VICODIN) 5-325 MG tablet Take 1 tablet by mouth every 4 (four) hours as needed for moderate pain (pain score 4-6). (Patient not taking: Reported on 09/14/2023) 20 tablet 0   ibuprofen (ADVIL) 600 MG tablet Take 1 tablet (600 mg total) by mouth every 6 (six) hours. (Patient not taking: Reported on 09/14/2023) 30 tablet 0   meloxicam (MOBIC) 15 MG tablet Take 1 tablet (15 mg total) by mouth daily. (Patient not taking: Reported on 09/14/2023) 30 tablet 1   oxyCODONE (ROXICODONE) 5 MG immediate release tablet Take 1 tablet (5 mg total) by mouth every 4 (four) hours as needed for severe pain or breakthrough pain. (Patient not taking:  Reported on 09/14/2023) 10 tablet 0   No current facility-administered medications for this visit.   No results found.  Review of Systems:   A ROS was performed including pertinent positives and negatives as documented in the HPI.   Musculoskeletal Exam:    unknown if currently breastfeeding.  Left knee incisions are well-appearing without erythema or drainage.  Lateral joint line is nontender.  Range of motion is from 0 to 100 degrees without pain.  Imaging:      I personally reviewed and interpreted the radiographs.   Assessment:   4 weeks status post left knee lateral meniscal repair overall doing well.  At this time she will continue to work through range of motion and strengthening through a guided rehab protocol.  I will plan  to see her back in 6 weeks for reassessment  Plan :    -Return to clinic 6 weeks to reassess      I personally saw and evaluated the patient, and participated in the management and treatment plan.  Huel Cote, MD Attending Physician, Orthopedic Surgery  This document was dictated using Dragon voice recognition software. A reasonable attempt at proof reading has been made to minimize errors.

## 2023-11-04 ENCOUNTER — Other Ambulatory Visit: Payer: Self-pay

## 2023-11-04 ENCOUNTER — Emergency Department (HOSPITAL_BASED_OUTPATIENT_CLINIC_OR_DEPARTMENT_OTHER)
Admission: EM | Admit: 2023-11-04 | Discharge: 2023-11-04 | Disposition: A | Discharge status: Home / Self Care | Payer: Medicaid Other

## 2023-11-04 DIAGNOSIS — J029 Acute pharyngitis, unspecified: Secondary | ICD-10-CM | POA: Diagnosis not present

## 2023-11-04 DIAGNOSIS — J3489 Other specified disorders of nose and nasal sinuses: Secondary | ICD-10-CM | POA: Insufficient documentation

## 2023-11-04 DIAGNOSIS — R0981 Nasal congestion: Secondary | ICD-10-CM | POA: Diagnosis present

## 2023-11-04 DIAGNOSIS — R051 Acute cough: Secondary | ICD-10-CM | POA: Diagnosis not present

## 2023-11-04 MED ORDER — PROMETHAZINE-DM 6.25-15 MG/5ML PO SYRP: 2.5000 mL | ORAL_SOLUTION | Freq: Four times a day (QID) | ORAL | 0 refills | Status: AC | PRN

## 2023-11-04 MED ORDER — CETIRIZINE-PSEUDOEPHEDRINE ER 5-120 MG PO TB12
1.0000 | ORAL_TABLET | Freq: Every day | ORAL | 0 refills | Status: AC
Start: 2023-11-04 — End: ?

## 2023-11-04 MED ORDER — TRIAMCINOLONE ACETONIDE 55 MCG/ACT NA AERO: 2.0000 | INHALATION_SPRAY | Freq: Every day | NASAL | 0 refills | Status: AC | PRN

## 2023-11-04 MED ORDER — OSELTAMIVIR PHOSPHATE 75 MG PO CAPS: 75.0000 mg | ORAL_CAPSULE | Freq: Two times a day (BID) | ORAL | 0 refills | Status: AC

## 2023-11-04 NOTE — ED Provider Notes (Signed)
 [name] Provider Note   CSN: [id number] Arrival date & time: 11/04/23  1632     History  Chief Complaint  Patient presents with   Cough    Martha Navarro is a 33 y.o. female.   Cough   33 year old female presents emergency department complaints of cough, nasal congestion, sore throat.  Patient ill with symptoms over the past 2 days.  Patient took an at home flu/COVID test which was positive for flu A this morning.  Has been trying over-the-counter medications with some relief of symptoms.  Presents emergency department for repeat assessment.  2 daughters ill at bedside with the same symptoms.  Denies any abdominal pain, nausea, vomiting, chest pain, shortness of breath, urinary symptoms, change in bowel habits.  Past medical history significant for spontaneous pneumothorax,  Home Medications Prior to Admission medications   Medication Sig Start Date End Date Taking? Authorizing Provider  cetirizine -pseudoephedrine  (ZYRTEC -D) 5-120 MG tablet Take 1 tablet by mouth daily. 11/04/23  Yes Silver Fell A, PA  oseltamivir  (TAMIFLU ) 75 MG capsule Take 1 capsule (75 mg total) by mouth every 12 (twelve) hours. 11/04/23  Yes Silver Fell A, PA  promethazine -dextromethorphan (PROMETHAZINE -DM) 6.25-15 MG/5ML syrup Take 2.5 mLs by mouth 4 (four) times daily as needed for cough. 11/04/23  Yes Silver Fell A, PA  triamcinolone  (NASACORT ) 55 MCG/ACT AERO nasal inhaler Place 2 sprays into the nose daily as needed. 11/04/23  Yes Silver Fell LABOR, PA      Allergies    Patient has no known allergies.    Review of Systems   Review of Systems  Respiratory:  Positive for cough.   All other systems reviewed and are negative.   Physical Exam Updated Vital Signs BP 130/89 (BP Location: Right Arm)   Pulse 80   Temp 98.2 F (36.8 C) (Oral)   Resp 18   SpO2 98%  Physical Exam Vitals and nursing note reviewed.  Constitutional:      General:  She is not in acute distress.    Appearance: She is well-developed.  HENT:     Head: Normocephalic and atraumatic.     Right Ear: Tympanic membrane normal.     Left Ear: Tympanic membrane normal.     Nose: Congestion and rhinorrhea present.     Mouth/Throat:     Comments: Mild posterior pharyngeal erythema.  Uvula midline right symmetric with phonation.  No sublingual extremity or swelling.  Tonsils 1+ bilaterally without obvious exudate.  No trismus. Eyes:     Conjunctiva/sclera: Conjunctivae normal.  Cardiovascular:     Rate and Rhythm: Normal rate and regular rhythm.     Heart sounds: No murmur heard. Pulmonary:     Effort: Pulmonary effort is normal. No respiratory distress.     Breath sounds: Normal breath sounds. No wheezing, rhonchi or rales.  Abdominal:     Palpations: Abdomen is soft.     Tenderness: There is no abdominal tenderness.  Musculoskeletal:        General: No swelling.     Cervical back: Neck supple.  Skin:    General: Skin is warm and dry.     Capillary Refill: Capillary refill takes less than 2 seconds.  Neurological:     Mental Status: She is alert.  Psychiatric:        Mood and Affect: Mood normal.     ED Results / Procedures / Treatments   Labs (all labs ordered are listed, but only abnormal results  are displayed) Labs Reviewed - No data to display  EKG None  Radiology No results found.  Procedures Procedures    Medications Ordered in ED Medications - No data to display  ED Course/ Medical Decision Making/ A&P                                 Medical Decision Making Risk OTC drugs. Prescription drug management.   This patient presents to the ED for concern of cough, nasal congestion, sore throat, this involves an extensive number of treatment options, and is a complaint that carries with it a high risk of complications and morbidity.  The differential diagnosis includes viral URI, COVID, flu, RSV, pneumonia, sepsis, other   Co  morbidities that complicate the patient evaluation  See HPI   Additional history obtained:  Additional history obtained from EMR External records from outside source obtained and reviewed including hospital records   Lab Tests:  N/a   Imaging Studies ordered:  N/a   Cardiac Monitoring: / EKG:  The patient was maintained on a cardiac monitor.  I personally viewed and interpreted the cardiac monitored which showed an underlying rhythm of: sinus rhythm   Consultations Obtained:  N/a   Problem List / ED Course / Critical interventions / Medication management  Viral URI with cough, sore throat, nasal congestion Reevaluation of the patient after these medicines showed that the patient stayed the same I have reviewed the patients home medicines and have made adjustments as needed   Social Determinants of Health:  Former cigarette use.  Denies illicit drug use.   Test / Admission - Considered:  Viral URI with cough, sore throat, nasal congestion Vitals signs within normal range and stable throughout visit. 33 year old female presents emergency department complaints of cough, nasal congestion, sore throat.  Patient with symptoms for the past 2 days.  2 family members ill at home with similar symptoms.  Had a positive influenza A test this morning.  Has been trying over-the-counter medications with some improvement of symptoms.  Presented for reassessment.  On exam, lungs clear to auscultation bilaterally; low suspicion for pneumonia.  Repeat viral testing deemed unnecessary given the patient has already had positive influenza A testing.  Will recommend symptomatic therapy as described in AVS and follow-up with primary care.  Patient overall well-appearing, afebrile in no acute distress. Worrisome signs and symptoms were discussed with the patient, and the patient acknowledged understanding to return to the ED if noticed. Patient was stable upon discharge.           Final Clinical Impression(s) / ED Diagnoses Final diagnoses:  Acute cough  Nasal congestion  Sore throat    Rx / DC Orders ED Discharge Orders          Ordered    promethazine -dextromethorphan (PROMETHAZINE -DM) 6.25-15 MG/5ML syrup  4 times daily PRN        11/04/23 1721    oseltamivir  (TAMIFLU ) 75 MG capsule  Every 12 hours        11/04/23 1721    triamcinolone  (NASACORT ) 55 MCG/ACT AERO nasal inhaler  Daily PRN        11/04/23 1721    cetirizine -pseudoephedrine  (ZYRTEC -D) 5-120 MG tablet  Daily        11/04/23 1721              Silver Wonda LABOR, [place] 11/04/23 1725    Ula Prentice SAUNDERS, MD 11/05/23 1530

## 2023-11-04 NOTE — Discharge Instructions (Addendum)
 As discussed, we will treat your symptoms at home with medicine called Tamiflu  which directly treats influenza infection.  Will additionally recommend allergy medicine such as Zyrtec  as well as nasal spray to help with symptoms along with cough suppressant that admits that did your pharmacy..  Continue to take Tylenol /Motrin  for any pain/fever.  Recommend follow-up with primary care.  Please do not hesitate to return to the emergency department the worrisome signs and symptoms we discussed become apparent.

## 2023-11-04 NOTE — ED Triage Notes (Signed)
 Pt c/o cough and fever for past few days - tested + for Flu A this morning.

## 2023-11-22 ENCOUNTER — Encounter (HOSPITAL_BASED_OUTPATIENT_CLINIC_OR_DEPARTMENT_OTHER): Payer: Medicaid Other | Admitting: Orthopaedic Surgery

## 2023-11-23 ENCOUNTER — Other Ambulatory Visit: Payer: Self-pay

## 2023-11-23 ENCOUNTER — Emergency Department (HOSPITAL_BASED_OUTPATIENT_CLINIC_OR_DEPARTMENT_OTHER)
Admission: EM | Admit: 2023-11-23 | Discharge: 2023-11-23 | Disposition: A | Discharge status: Home / Self Care | Payer: Medicaid Other | Attending: Emergency Medicine | Admitting: Emergency Medicine

## 2023-11-23 DIAGNOSIS — J029 Acute pharyngitis, unspecified: Secondary | ICD-10-CM | POA: Diagnosis present

## 2023-11-23 DIAGNOSIS — J020 Streptococcal pharyngitis: Secondary | ICD-10-CM | POA: Insufficient documentation

## 2023-11-23 LAB — GROUP A STREP BY PCR: Group A Strep by PCR: DETECTED — AB

## 2023-11-23 MED ORDER — PENICILLIN G BENZATHINE 1200000 UNIT/2ML IM SUSY
1.2000 10*6.[IU] | PREFILLED_SYRINGE | Freq: Once | INTRAMUSCULAR | Status: AC
  Administered 2023-11-23: 1.2 10*6.[IU] via INTRAMUSCULAR
  Filled 2023-11-23: qty 2

## 2023-11-23 NOTE — ED Triage Notes (Signed)
Pt c/o sore throat and right ear pain that started yesterday morning.  Pt reports h/o tonsilitis and states this feels similar.  Pt has not taken any OTC medications today.  Pt denies fever at home.

## 2023-11-23 NOTE — ED Provider Notes (Signed)
[place] EMERGENCY DEPARTMENT AT [facility] Provider Note   CSN: [id number] Arrival date & time: 11/23/23  1015     History  Chief Complaint  Patient presents with   Sore Throat    Martha Navarro is a 33 y.o. female.  Patient presents to the emergency department for evaluation of sore throat and right ear pain starting yesterday.  She has a history of tonsillitis and feels that her symptoms are similar to when she has had this in the past.  She denies fevers.  She has been using drops in her ear and numbing spray for her throat.  She has also taken 800 mg of ibuprofen without improvement.  She is able to swallow.  No breathing difficulties.  No known sick contacts.      Home Medications Prior to Admission medications   Medication Sig Start Date End Date Taking? Authorizing Provider  cetirizine-pseudoephedrine (ZYRTEC-D) 5-120 MG tablet Take 1 tablet by mouth daily. 11/04/23   Peter Garter, PA  oseltamivir (TAMIFLU) 75 MG capsule Take 1 capsule (75 mg total) by mouth every 12 (twelve) hours. 11/04/23   Peter Garter, PA  promethazine-dextromethorphan (PROMETHAZINE-DM) 6.25-15 MG/5ML syrup Take 2.5 mLs by mouth 4 (four) times daily as needed for cough. 11/04/23   Peter Garter, PA  triamcinolone (NASACORT) 55 MCG/ACT AERO nasal inhaler Place 2 sprays into the nose daily as needed. 11/04/23   Peter Garter, PA      Allergies    Patient has no known allergies.    Review of Systems   Review of Systems  Physical Exam Updated Vital Signs BP 125/83 (BP Location: Right Arm)   Pulse 97   Temp 98.1 F (36.7 C)   Resp 19   LMP 11/09/2023 (Approximate)   SpO2 100%   Breastfeeding No   Physical Exam Vitals and nursing note reviewed.  Constitutional:      Appearance: She is well-developed.  HENT:     Head: Normocephalic and atraumatic.     Jaw: No trismus.     Right Ear: Ear canal and external ear normal. Tympanic membrane is retracted.     Left Ear:  Tympanic membrane, ear canal and external ear normal. Tympanic membrane is not retracted.     Nose: Congestion present. No mucosal edema or rhinorrhea.     Mouth/Throat:     Mouth: Mucous membranes are moist. Mucous membranes are not dry. No oral lesions.     Pharynx: Uvula midline. Oropharyngeal exudate and posterior oropharyngeal erythema present. No uvula swelling.     Tonsils: No tonsillar abscesses.  Eyes:     General:        Right eye: No discharge.        Left eye: No discharge.     Conjunctiva/sclera: Conjunctivae normal.  Cardiovascular:     Rate and Rhythm: Normal rate and regular rhythm.     Heart sounds: Normal heart sounds.  Pulmonary:     Effort: Pulmonary effort is normal. No respiratory distress.     Breath sounds: Normal breath sounds. No wheezing or rales.  Abdominal:     Palpations: Abdomen is soft.     Tenderness: There is no abdominal tenderness.  Musculoskeletal:     Cervical back: Normal range of motion and neck supple.  Lymphadenopathy:     Cervical: No cervical adenopathy.  Skin:    General: Skin is warm and dry.  Neurological:     Mental Status: She is alert.  Psychiatric:  Mood and Affect: Mood normal.     ED Results / Procedures / Treatments   Labs (all labs ordered are listed, but only abnormal results are displayed) Labs Reviewed  GROUP A STREP BY PCR - Abnormal; Notable for the following components:      Result Value   Group A Strep by PCR DETECTED (*)    All other components within normal limits    EKG None  Radiology No results found.  Procedures Procedures    Medications Ordered in ED Medications - No data to display  ED Course/ Medical Decision Making/ A&P    Patient seen and examined. History obtained directly from patient. Work-up including labs, imaging, EKG ordered in triage, if performed, were reviewed.    Labs/EKG: Independently reviewed and interpreted.  This included: Strep test that was positive  Imaging:  None ordered  Medications/Fluids: Ordered: IM Bicillin.  We discussed 1 dose Bicillin versus daily amox.  Patient prefers Bicillin.  Most recent vital signs reviewed and are as follows: BP 128/78   Pulse 87   Temp 98.1 F (36.7 C)   Resp 20   LMP 11/09/2023 (Approximate)   SpO2 100%   Breastfeeding No   Initial impression: Strep pharyngitis  Home treatment plan: NSAIDs/Tylenol for pain, adjust diet, maintain good hydration  Return instructions discussed with patient: Return with difficulty breathing or swallowing, new or worsening symptoms  Follow-up instructions discussed with patient: PCP in 5 days if not improving                                Medical Decision Making Risk Prescription drug management.   In regards to the patient's sore throat today, the following dangerous and potentially life threatening etiologies were considered on the differential diagnosis: Lugwig's angina, uvulitis, epiglottis, peritonsillar abscess, retropharyngeal abscess, Lemierre's syndrome. Also considered were more common causes such as: streptococcal pharyngitis, gonococcal pharyngitis, non-bacterial pharyngitis (cold viruses, HSV/coxsackievirus, influenza, COVID-19, infectious mononucleosis, oropharyngeal candidiasis), and other non-infectious causes including seasonal allergies/post-nasal drip, GERD/esophagitis, trauma.   The patient's vital signs, pertinent lab work and imaging were reviewed and interpreted as discussed in the ED course. Hospitalization was considered for further testing, treatments, or serial exams/observation. However as patient is well-appearing, has a stable exam, and reassuring studies today, I do not feel that they warrant admission at this time. This plan was discussed with the patient who verbalizes agreement and comfort with this plan and seems reliable and able to return to the Emergency Department with worsening or changing symptoms.          Final Clinical  Impression(s) / ED Diagnoses Final diagnoses:  Strep throat    Rx / DC Orders ED Discharge Orders     None         Renne Crigler, PA-C 11/23/23 1143    Terald Sleeper, MD 11/23/23 1600

## 2023-11-23 NOTE — Discharge Instructions (Signed)
Please read and follow all provided instructions.  Your diagnoses today include:  1. Strep throat    Tests performed today include: Strep test: was POSITIVE for strep throat Vital signs. See below for your results today.   Medications prescribed:  You were given a one-time shot of penicillin to treat your strep throat.   Please use over-the-counter NSAID medications (ibuprofen, naproxen) or Tylenol (acetaminophen) as directed on the packaging for pain -- as long as you do not have any reasons avoid these medications. Reasons to avoid NSAID medications include: weak kidneys, a history of bleeding in your stomach or gut, or uncontrolled high blood pressure or previous heart attack. Reasons to avoid Tylenol include: liver problems or ongoing alcohol use. Never take more than 4000mg  or 8 Extra strength Tylenol in a 24 hour period.     Take any medications prescribed only as directed.   Home care instructions:  Please read the educational materials provided and follow any instructions contained in this packet.  Follow-up instructions: Please follow-up with your primary care provider as needed for further evaluation of your symptoms.  Return instructions:  Please return to the Emergency Department if you experience worsening symptoms.  Return if you have worsening problems swallowing, your neck becomes swollen, you cannot swallow your saliva or your voice becomes muffled.  Return with high persistent fever, persistent vomiting, or if you have trouble breathing.  Please return if you have any other emergent concerns.  Additional Information:  Your vital signs today were: BP 125/83 (BP Location: Right Arm)   Pulse 97   Temp 98.1 F (36.7 C)   Resp 19   LMP 11/09/2023 (Approximate)   SpO2 100%   Breastfeeding No  If your blood pressure (BP) was elevated above 135/85 this visit, please have this repeated by your doctor within one month. --------------

## 2023-11-23 NOTE — ED Notes (Signed)

## 2024-07-20 ENCOUNTER — Emergency Department (HOSPITAL_BASED_OUTPATIENT_CLINIC_OR_DEPARTMENT_OTHER): Admission: EM | Admit: 2024-07-20 | Discharge: 2024-07-20 | Discharge status: Against Medical Advice

## 2024-07-20 NOTE — ED Notes (Signed)
 Called x 3 no answer

## 2024-07-20 NOTE — ED Notes (Signed)
 Called pt to triage.  No answer.

## 2024-08-26 ENCOUNTER — Encounter: Payer: Self-pay | Admitting: Radiology

## 2024-09-23 ENCOUNTER — Ambulatory Visit: Payer: Self-pay | Admitting: *Deleted

## 2024-09-23 ENCOUNTER — Emergency Department (HOSPITAL_BASED_OUTPATIENT_CLINIC_OR_DEPARTMENT_OTHER)

## 2024-09-23 ENCOUNTER — Encounter (HOSPITAL_BASED_OUTPATIENT_CLINIC_OR_DEPARTMENT_OTHER): Payer: Self-pay

## 2024-09-23 ENCOUNTER — Emergency Department (HOSPITAL_BASED_OUTPATIENT_CLINIC_OR_DEPARTMENT_OTHER)
Admission: EM | Admit: 2024-09-23 | Discharge: 2024-09-23 | Disposition: A | Discharge status: Home / Self Care | Source: Ambulatory Visit | Attending: Emergency Medicine | Admitting: Emergency Medicine

## 2024-09-23 ENCOUNTER — Other Ambulatory Visit: Payer: Self-pay

## 2024-09-23 ENCOUNTER — Telehealth (HOSPITAL_BASED_OUTPATIENT_CLINIC_OR_DEPARTMENT_OTHER): Payer: Self-pay | Admitting: Orthopaedic Surgery

## 2024-09-23 DIAGNOSIS — M25562 Pain in left knee: Secondary | ICD-10-CM | POA: Diagnosis present

## 2024-09-23 DIAGNOSIS — R202 Paresthesia of skin: Secondary | ICD-10-CM | POA: Diagnosis not present

## 2024-09-23 MED ORDER — KETOROLAC TROMETHAMINE 30 MG/ML IJ SOLN
30.0000 mg | Freq: Once | INTRAMUSCULAR | Status: AC
  Administered 2024-09-23: 30 mg via INTRAMUSCULAR
  Filled 2024-09-23: qty 1

## 2024-09-23 MED ORDER — ACETAMINOPHEN 500 MG PO TABS
1000.0000 mg | ORAL_TABLET | Freq: Once | ORAL | Status: DC
  Filled 2024-09-23: qty 2

## 2024-09-23 NOTE — Telephone Encounter (Signed)
 Recommended ED due to not able to bend knee. No PCP and has had surgery on same knee last Nov.       FYI Only or Action Required?: FYI only for provider: ED advised and no PCP.  Patient was last seen in primary care on na.  Called Nurse Triage reporting Knee Pain.  Symptoms began several weeks ago.  Interventions attempted: Rest, hydration, or home remedies.  Symptoms are: rapidly worsening.  Triage Disposition: See HCP Within 4 Hours (Or PCP Triage)  Patient/caregiver understands and will follow disposition?: Yes             Copied from CRM [id number]. Topic: Clinical - Red Word Triage >> Sep 23, 2024  8:07 AM Tobias L wrote: Red Word that prompted transfer to Nurse Triage: something has ruptured in left, a lot of fluid, unable to bend knee, bottom half of leg is numb and tingly, swelling not going down with ice or heat Reason for Disposition  [1] SEVERE pain (e.g., excruciating, unable to walk) AND [2] not improved after 2 hours of pain medicine  Answer Assessment - Initial Assessment Questions 1. LOCATION and RADIATION: Where is the pain located?      Left knee 2. QUALITY: What does the pain feel like?  (e.g., sharp, dull, aching, burning)     Pain  3. SEVERITY: How bad is the pain? What does it keep you from doing?   (Scale 1-10; or mild, moderate, severe)     severe 4. ONSET: When did the pain start? Does it come and go, or is it there all the time?     Worsening over 2 weeks  5. RECURRENT: Have you had this pain before? If Yes, ask: When, and what happened then?     No . Felt like something ruptured' left knee and unable to bend  6. SETTING: Has there been any recent work, exercise or other activity that involved that part of the body?      na 7. AGGRAVATING FACTORS: What makes the knee pain worse? (e.g., walking, climbing stairs, running)     Walking  8. ASSOCIATED SYMPTOMS: Is there any swelling or redness of the knee?     Swelling  left knee 9. OTHER SYMPTOMS: Do you have any other symptoms? (e.g., calf pain, chest pain, difficulty breathing, fever)     Pain left knee 10. PREGNANCY: Is there any chance you are pregnant? When was your last menstrual period?       na  Protocols used: Knee Pain-A-AH

## 2024-09-23 NOTE — ED Triage Notes (Signed)
 Left knee pain onset 2 weeks ago and pain is worsening. Taking ibuprofen  and icing for pain relief. Using brace. PMH mensicus tear repair 09/04/2023. Able to ambulate.

## 2024-09-23 NOTE — ED Provider Notes (Signed)
 [place] EMERGENCY DEPARTMENT AT [facility] Provider Note   CSN: [id number] Arrival date & time: 09/23/24  9148     History Chief Complaint  Patient presents with   Knee Pain    HPI: Martha Navarro is a 33 y.o. female with history pertinent meniscus repair in November 2024 who presents complaining of left knee pain. Patient arrived via POV accompanied by friend and daughter.  History provided by patient.  No interpreter required during this encounter.  Patient reports that she had a meniscus repair in her left knee in November 2024 with Dr. Elspeth Parker reports that she was unable to adhere to her physical therapy regimen due to being busy with 2 small children.  Reports that she had overall been doing well until approximately 2 weeks ago when she was trying to get on her bed, and put her knee down on the bed, and put all of the weight on her knee, reports that since then she has had severe pain particularly with active and passive range of motion, and had swelling of her knee, though notes that the swelling has partially improved.  Reports that she has been using Tylenol  and ibuprofen  with partial relief, however she still has significant pain.  She has been reutilizing her knee brace, however is able to ambulate with her brace.  Reports that she intermittently has some tingling of her left lower extremity.  Patient's recorded medical, surgical, social, medication list and allergies were reviewed in the Snapshot window as part of the initial history.   Prior to Admission medications   Medication Sig Start Date End Date Taking? Authorizing Provider  cetirizine -pseudoephedrine  (ZYRTEC -D) 5-120 MG tablet Take 1 tablet by mouth daily. 11/04/23   Silver Wonda LABOR, PA  oseltamivir  (TAMIFLU ) 75 MG capsule Take 1 capsule (75 mg total) by mouth every 12 (twelve) hours. 11/04/23   Silver Wonda LABOR, PA  promethazine -dextromethorphan (PROMETHAZINE -DM) 6.25-15 MG/5ML syrup Take 2.5 mLs by  mouth 4 (four) times daily as needed for cough. 11/04/23   Silver Wonda LABOR, PA  triamcinolone  (NASACORT ) 55 MCG/ACT AERO nasal inhaler Place 2 sprays into the nose daily as needed. 11/04/23   Silver Wonda LABOR, PA     Allergies: Patient has no known allergies.   Review of Systems   ROS as per HPI  Physical Exam Updated Vital Signs BP 109/80 (BP Location: Right Arm)   Pulse 79   Temp 97.9 F (36.6 C)   Resp 16   SpO2 100%   Breastfeeding No  Physical Exam Vitals and nursing note reviewed.  Constitutional:      General: She is not in acute distress.    Appearance: She is well-developed.  HENT:     Head: Normocephalic and atraumatic.  Eyes:     Conjunctiva/sclera: Conjunctivae normal.  Cardiovascular:     Rate and Rhythm: Normal rate and regular rhythm.     Heart sounds: No murmur heard. Pulmonary:     Effort: Pulmonary effort is normal. No respiratory distress.     Breath sounds: Normal breath sounds.  Abdominal:     Palpations: Abdomen is soft.     Tenderness: There is no abdominal tenderness.  Musculoskeletal:        General: No swelling.     Cervical back: Neck supple.     Left knee: Swelling present. No crepitus. No LCL laxity, MCL laxity, ACL laxity or PCL laxity.Normal alignment. Normal pulse.     Comments: Knee and foot neurovascularly intact, 2-second cap refill,  intact sensation to light touch in all nerve distributions  Skin:    General: Skin is warm and dry.     Capillary Refill: Capillary refill takes less than 2 seconds.  Neurological:     Mental Status: She is alert.  Psychiatric:        Mood and Affect: Mood normal.     ED Course/ Medical Decision Making/ A&P    Procedures Procedures   Medications Ordered in ED Medications  acetaminophen  (TYLENOL ) tablet 1,000 mg (1,000 mg Oral Patient Refused/Not Given 09/23/24 1023)  ketorolac  (TORADOL ) 30 MG/ML injection 30 mg (30 mg Intramuscular Given 09/23/24 1019)    Medical Decision Making:   Martha Navarro is a 33 y.o. female who presents for knee pain as per above.  Physical exam is pertinent for slight swelling of the left knee with stable ligamentous testing.   The differential includes but is not limited to fracture, dislocation, meniscus injury, degenerative changes, sprain, strain, neurovascular injury, gout, septic arthritis.  Independent historian: None  External data reviewed: Notes: Reviewed op note from 09/04/2023 with Dr.Bokshan  Labs: Not indicated  Radiology: Ordered, Independent interpretation, Details: Personally reviewed plain film of the left knee, do not appreciate fracture, dislocation, significant effusion or degenerative changes, and All images reviewed independently. ***Agree with radiology report at this time.   DG Knee Complete 4 Views Left Result Date: 09/23/2024 CLINICAL DATA:  Two-week history of left knee pain. History of meniscus tear status post repair EXAM: LEFT KNEE - COMPLETE 4 VIEW COMPARISON:  Left knee radiograph dated 06/27/2023 FINDINGS: There are no findings of fracture or dislocation. Small joint effusion. There is no evidence of arthropathy or other focal bone abnormality. Soft tissues are unremarkable. IMPRESSION: Small joint effusion. No acute fracture or dislocation. Electronically Signed   By: Limin  Xu M.D.   On: 09/23/2024 10:43    EKG/Medicine tests: Not indicated EKG Interpretation:                  Interventions: Toradol , Tylenol   See the EMR for full details regarding lab and imaging results.  On exam, patient is vitally stable, well-appearing, afebrile, no erythema, warmth, clinical appearance not concerning for gout, septic arthritis.  Patient's knee is stable to ligamentous testing, doubt sprain, strain.  Leg neurovascularly intact, doubt neuropraxia, vessel injury.  Will obtain x-ray to evaluate for fracture, dislocation, significant degenerative changes.  This was obtained and does not demonstrate emergent abnormalities, therefore  do feel that patient is stable for discharge and outpatient follow-up with orthopedic surgery for more specialized follow-up.  Discussed this with patient.  Recommended Tylenol , ibuprofen , muscle relaxers for pain control.  Did provide patient a dose of Toradol  in the emergency department, patient reports that she has no possibility of pregnancy as she has not been sexually active in 5 months, offered pregnancy test, patient acknowledges the risks that Toradol  would present to her fetus, and declines pregnancy test at this time which I believe is reasonable given patient's sexual abstinence.  Presentation is most consistent with acute uncomplicated illness  Discussion of management or test interpretations with external provider(s): Not indicated  Risk Drugs:OTC drugs and Prescription drug management  Disposition: DISCHARGE: I believe that the patient is safe for discharge home with outpatient follow-up. Patient was informed of all pertinent physical exam, laboratory, and imaging findings. Patient's suspected etiology of their symptom presentation was discussed with the patient and all questions were answered. We discussed following up with PCP and orthopedic surgery. I provided  thorough ED return precautions. The patient feels safe and comfortable with this plan.  MDM generated using voice dictation software and may contain dictation errors.  Please contact me for any clarification or with any questions.  Clinical Impression:  1. Acute pain of left knee      Discharge   Final Clinical Impression(s) / ED Diagnoses Final diagnoses:  Acute pain of left knee    Rx / DC Orders ED Discharge Orders          Ordered    Ambulatory referral to Orthopedic Surgery        09/23/24 1100

## 2024-09-23 NOTE — ED Notes (Signed)
 DC paperwork given and verbally understood.

## 2024-09-23 NOTE — ED Notes (Addendum)
 Provider cleared Toradol  without Preg test... Pt aware of risk of Toradol  and preg. Pt stated that she took 800mg  of advil  before arrival, [facility] cleared Toradol .

## 2024-09-23 NOTE — Telephone Encounter (Signed)
 Patient went to ED and wants to know if Dr B could refill hydrocodone  5mg  for her pain because the Ed would not since he prescribe them. Please advise though my chart

## 2024-09-23 NOTE — Discharge Instructions (Signed)
 Martha Navarro  Thank you for allowing us  to take care of you today.  You came to the Emergency Department today because you have had recurrent left knee pain over the past 2 weeks after getting on the bed in a funny way.  Here in the emergency department you do not have redness, swelling, and your symptoms are not consistent with gout or an infection in your knee.  You do not have any broken bones or bones that are out of place on your x-ray.  Your ligaments are stable.  Your symptoms are most likely related to some degenerative changes and or persistent disease of your meniscus, we recommend following up with your orthopedic surgeon, Dr. Genelle.  We have given you a referral back to his care.  We recommend alternating 600 mg over-the-counter ibuprofen  with 500 mg over-the-counter Tylenol  every 4-6 hours for pain control as well as rest, ice, elevation, compression, and continued use of your brace.  To-Do: 1. Please follow-up with your primary doctor within 1 - 2 weeks / as soon as possible.    Please return to the Emergency Department or call 911 if you experience have worsening of your symptoms, or do not get better, new difficulty moving or feeling your lower leg, chest pain, shortness of breath, severe or significantly worsening pain, high fever, severe confusion, pass out or have any reason to think that you need emergency medical care.   We hope you feel better soon.   Mitzie Later, MD Department of Emergency Medicine [facility]
